# Patient Record
Sex: Female | Born: 1960 | Hispanic: Yes | Marital: Married | Smoking: Never smoker
Health system: Southern US, Community
[De-identification: ages and names within clinical notes are randomized; demographics above are authoritative.]

## PROBLEM LIST (undated history)

## (undated) DIAGNOSIS — I10 Essential (primary) hypertension: Secondary | ICD-10-CM

## (undated) DIAGNOSIS — R1110 Vomiting, unspecified: Secondary | ICD-10-CM

## (undated) DIAGNOSIS — R635 Abnormal weight gain: Secondary | ICD-10-CM

## (undated) DIAGNOSIS — R410 Disorientation, unspecified: Secondary | ICD-10-CM

## (undated) DIAGNOSIS — F32A Depression, unspecified: Secondary | ICD-10-CM

## (undated) DIAGNOSIS — M79606 Pain in leg, unspecified: Secondary | ICD-10-CM

## (undated) DIAGNOSIS — K2970 Gastritis, unspecified, without bleeding: Secondary | ICD-10-CM

## (undated) DIAGNOSIS — R531 Weakness: Secondary | ICD-10-CM

## (undated) DIAGNOSIS — IMO0002 Reserved for concepts with insufficient information to code with codable children: Secondary | ICD-10-CM

## (undated) DIAGNOSIS — G709 Myoneural disorder, unspecified: Secondary | ICD-10-CM

## (undated) DIAGNOSIS — F329 Major depressive disorder, single episode, unspecified: Secondary | ICD-10-CM

## (undated) HISTORY — DX: Reserved for concepts with insufficient information to code with codable children: IMO0002

## (undated) HISTORY — DX: Pain in leg, unspecified: M79.606

## (undated) HISTORY — DX: Essential (primary) hypertension: I10

## (undated) HISTORY — DX: Major depressive disorder, single episode, unspecified: F32.9

## (undated) HISTORY — DX: Disorientation, unspecified: R41.0

## (undated) HISTORY — DX: Depression, unspecified: F32.A

## (undated) HISTORY — DX: Weakness: R53.1

## (undated) HISTORY — DX: Gastritis, unspecified, without bleeding: K29.70

## (undated) HISTORY — DX: Abnormal weight gain: R63.5

## (undated) HISTORY — DX: Vomiting, unspecified: R11.10

## (undated) HISTORY — DX: Myoneural disorder, unspecified: G70.9

---

## 2006-07-20 HISTORY — PX: OVARIAN CYST SURGERY: SHX726

## 2008-07-20 HISTORY — PX: SHOULDER SURGERY: SHX246

## 2009-07-23 LAB — HM PAP SMEAR: HM Pap smear: NORMAL

## 2010-11-21 ENCOUNTER — Encounter: Payer: Self-pay | Admitting: Family Medicine

## 2010-11-21 ENCOUNTER — Ambulatory Visit (INDEPENDENT_AMBULATORY_CARE_PROVIDER_SITE_OTHER): Payer: 59 | Admitting: Family Medicine

## 2010-11-21 VITALS — BP 140/80 | HR 72 | Temp 97.8°F | Resp 12 | Ht 62.75 in | Wt 171.0 lb

## 2010-11-21 DIAGNOSIS — R112 Nausea with vomiting, unspecified: Secondary | ICD-10-CM

## 2010-11-21 DIAGNOSIS — F3289 Other specified depressive episodes: Secondary | ICD-10-CM

## 2010-11-21 DIAGNOSIS — R1013 Epigastric pain: Secondary | ICD-10-CM

## 2010-11-21 DIAGNOSIS — F32A Depression, unspecified: Secondary | ICD-10-CM

## 2010-11-21 DIAGNOSIS — F329 Major depressive disorder, single episode, unspecified: Secondary | ICD-10-CM

## 2010-11-21 LAB — CBC WITH DIFFERENTIAL/PLATELET
Basophils Absolute: 0 10*3/uL (ref 0.0–0.1)
Basophils Relative: 0.4 % (ref 0.0–3.0)
Eosinophils Absolute: 0 10*3/uL (ref 0.0–0.7)
HCT: 39.3 % (ref 36.0–46.0)
Hemoglobin: 13.4 g/dL (ref 12.0–15.0)
Lymphs Abs: 0.9 10*3/uL (ref 0.7–4.0)
MCHC: 34.1 g/dL (ref 30.0–36.0)
MCV: 91.8 fl (ref 78.0–100.0)
Monocytes Absolute: 0.3 10*3/uL (ref 0.1–1.0)
Neutro Abs: 6.5 10*3/uL (ref 1.4–7.7)
RBC: 4.28 Mil/uL (ref 3.87–5.11)
RDW: 15.2 % — ABNORMAL HIGH (ref 11.5–14.6)

## 2010-11-21 LAB — HEPATIC FUNCTION PANEL
AST: 37 U/L (ref 0–37)
Albumin: 4.8 g/dL (ref 3.5–5.2)
Alkaline Phosphatase: 76 U/L (ref 39–117)
Bilirubin, Direct: 0.1 mg/dL (ref 0.0–0.3)
Total Bilirubin: 0.8 mg/dL (ref 0.3–1.2)

## 2010-11-21 LAB — BASIC METABOLIC PANEL
GFR: 86.34 mL/min (ref >60.00)
Glucose, Bld: 115 mg/dL — ABNORMAL HIGH (ref 70–99)
Potassium: 4.2 mEq/L (ref 3.5–5.1)
Sodium: 143 mEq/L (ref 135–145)

## 2010-11-21 MED ORDER — PROMETHAZINE HCL 25 MG PO TABS: 25.0000 mg | ORAL_TABLET | Freq: Four times a day (QID) | ORAL | Status: DC | PRN

## 2010-11-21 MED ORDER — PROMETHAZINE HCL 25 MG/ML IJ SOLN
25.0000 mg | Freq: Once | INTRAMUSCULAR | Status: AC
  Administered 2010-11-21: 25 mg via INTRAMUSCULAR

## 2010-11-21 NOTE — Patient Instructions (Signed)
Follow up immediately for any fever or persistent abdominal pain.

## 2010-11-21 NOTE — Progress Notes (Signed)
  Subjective:    Patient ID: Pamela Hubbard, female    DOB: 08/01/1960, 50 y.o.   MRN: [id number]  HPI Patient seen to establish care. Past medical history reviewed. Questionable remote history peptic ulcer disease many years ago. Reported history of depression currently treated with Cymbalta. She has some chronic shoulder and neck pain with prior rotator cuff shoulder surgery. Reported history of ovarian cyst.  She is scheduled to see orthopedist next week here in town regarding her right shoulder.  Seen today with some recurrent midepigastric pain with associated nausea and vomiting. Symptoms are worse after eating-particularly after heavy meals or fatty foods.  . She relates about one year ago had EGD when living in [place] with no peptic ulcer disease noted. Was seen at urgent care about one week ago and reportedly had normal ultrasound . No gallstones noted. Occasional radiation to the back and right upper quadrant but mostly midepigastric. No fever. No hematemesis. No melena. No nonsteroidal use. Patient denies appetite or weight changes. Nonsmoker. Occasional alcohol use. No history of pancreatitis.   Review of Systems  Constitutional: Negative for fever, chills, activity change, appetite change, fatigue and unexpected weight change.  Respiratory: Negative for cough and shortness of breath.   Cardiovascular: Negative for chest pain.  Gastrointestinal: Positive for nausea and vomiting. Negative for abdominal pain, diarrhea, constipation, blood in stool and abdominal distention.  Genitourinary: Negative for dysuria and flank pain.  Neurological: Negative for dizziness.       Objective:   Physical Exam  Constitutional: She appears well-developed and well-nourished.  Cardiovascular: Normal rate and regular rhythm.   No murmur heard. Pulmonary/Chest: Effort normal and breath sounds normal. No respiratory distress. She has no wheezes. She has no rales.  Abdominal: Soft. Bowel sounds are normal.  She exhibits no distension.       Tender midepigastric area. No guarding or rebound. No hepatomegaly or splenomegaly noted.  Musculoskeletal: She exhibits no edema.          Assessment & Plan:  #1 recurrent midepigastric pain with associated nausea and vomiting. Recent ultrasound reportedly negative for gallstones. Obtain old records. Obtain labs with CBC, lipase, hepatic panel, and basic metabolic panel. ?acalculous cholecystitis. #2 chronic right shoulder pain  #3 history of depression #4 reported history of peptic ulcer disease several years ago

## 2010-11-23 DIAGNOSIS — F329 Major depressive disorder, single episode, unspecified: Secondary | ICD-10-CM | POA: Insufficient documentation

## 2010-11-23 DIAGNOSIS — F32A Depression, unspecified: Secondary | ICD-10-CM | POA: Insufficient documentation

## 2010-11-24 NOTE — Progress Notes (Signed)
Quick Note:  Pt informed. She will get [place] results to Dr Caryl Never ______

## 2010-12-01 ENCOUNTER — Encounter: Payer: Self-pay | Admitting: Family Medicine

## 2010-12-04 ENCOUNTER — Ambulatory Visit (HOSPITAL_BASED_OUTPATIENT_CLINIC_OR_DEPARTMENT_OTHER)
Admission: RE | Admit: 2010-12-04 | Discharge: 2010-12-04 | Disposition: A | Discharge status: Home / Self Care | Payer: 59 | Source: Ambulatory Visit | Attending: Orthopedic Surgery | Admitting: Orthopedic Surgery

## 2010-12-04 DIAGNOSIS — D1600 Benign neoplasm of scapula and long bones of unspecified upper limb: Secondary | ICD-10-CM | POA: Insufficient documentation

## 2010-12-04 DIAGNOSIS — Z01812 Encounter for preprocedural laboratory examination: Secondary | ICD-10-CM | POA: Insufficient documentation

## 2010-12-04 DIAGNOSIS — M719 Bursopathy, unspecified: Secondary | ICD-10-CM | POA: Insufficient documentation

## 2010-12-04 DIAGNOSIS — M25819 Other specified joint disorders, unspecified shoulder: Secondary | ICD-10-CM | POA: Insufficient documentation

## 2010-12-04 DIAGNOSIS — M67919 Unspecified disorder of synovium and tendon, unspecified shoulder: Secondary | ICD-10-CM | POA: Insufficient documentation

## 2010-12-04 LAB — POCT HEMOGLOBIN-HEMACUE: Hemoglobin: 12.6 g/dL (ref 12.0–15.0)

## 2010-12-09 NOTE — Op Note (Signed)
[name], Pamela Hubbard                  ACCOUNT NO.:  [id number]  MEDICAL RECORD NO.:  [id number]           PATIENT TYPE:  LOCATION:                                 FACILITY:  PHYSICIAN:  Katy Fitch. Leronda Lewers, M.D.      DATE OF BIRTH:  DATE OF PROCEDURE:  12/01/2010 DATE OF DISCHARGE:                              OPERATIVE REPORT   PREOPERATIVE DIAGNOSIS:  Two-year history of right shoulder pain, status post prior arthroscopic subacromial decompression and arthroscopic distal clavicle resection completed in [place].  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  OPERATION: 1. Open lateral acromioplasty, right shoulder. 2. Bursectomy, right shoulder. 3. Simple diagnostic arthroscopy to confirm intact labrum, biceps     origin and rotator cuff articular side integrity.  OPERATING SURGEON:  Katy Fitch. Tyrail Grandfield, MDASSISTANT:  Marveen Reeks Dasnoit, PA-C  ANESTHESIA:  General endotracheal supplemented by a right brachial plexus block.  SUPERVISED ANESTHESIOLOGIST:  Burna Forts, MD  INDICATIONS:  Pamela Hubbard is a 50 year old right-hand dominant woman who removed from [place] to [place].  Her husband is working with [employer] in [place].  After evaluating her husband for carpal tunnel syndrome, Mr. Gloeckner requested that we evaluate his wife shoulder.  She had a history of prior arthroscopic subacromial decompression, distal clavicle resection and partial open acromionectomy performed in [place].  She reports that following surgery she was never relieved of pain.  She had pain with initiation of abduction, internal external rotation, crepitation beneath the acromion and night pain.  On clinical examination, she was noted to have a number of orthopedic issues including cervical degenerative disk disease, a painful arc of shoulder abduction, scaption and  external rotation, crepitation beneath the acromion, and plain x-rays revealed what appeared to be an anterior acromioplasty with some residual lateral acromion present.  She had marked guarding.  She also had arthritis of multiple small joints.  We advised her to obtain an MRI of the shoulder in that her prior MRI from [place] had revealed significant tendinosis.  Our concern was that she might be progressing to a full-thickness rotator cuff tear.  Her MRI was accomplished a tried imaging and notable for what appeared to be a retained profile of the lateral acromion with bursal hypertrophy surrounding it, suggesting a chronic impingement point.  Clinically, she was tenderness region, she had crepitation, a repeat anterior tilt view of the acromion revealed lateral osteophyte remaining, therefore, we recommended proceeding with exploration at this time anticipating open acromioplasty, bursectomy and open palpation and inspection of the rotator cuff.  We also advised to possible diagnostic shoulder scope to be absolute certain she does not have a SLAP lesion or other pathology accounting for her persistent discomfort.  After informed consent, Pamela Hubbard was brought to the operating room at this time.  PROCEDURE:  [name] was brought to room 2 of the [facility] and placed in supine position on the operating table.  Following an anesthesia consult by Dr. Jacklynn Bue, general anesthesia by endotracheal technique supplemented by a  right interscalene block was recommended and accepted by [name].  Under IV sedation conditions in the holding area, Dr. Jacklynn Bue placed a lidocaine and Marcaine interscalene block without complication.  Pamela Hubbard was then transferred to room 2 of the [facility] and placed in supine position on the operating table.  Under Dr. Marlane Mingle direct supervision, general anesthesia by endotracheal technique was induced followed by positioning Ms.  Hubbard in the beach-chair position with the aid of a torso and head holder designed for shoulder arthroscopy.  Detailed examination of shoulder under anesthesia revealed marked crepitation at the anterolateral corner of the acromion and no evidence of adhesive capsulitis.  Under general anesthesia, she had combined elevation 180 degrees, external rotation 95, internal rotation of 90, and extension to the interscapular plane.  The right upper extremity and forequarter were prepped with DuraPrep and draped with impervious arthroscopy drapes.  A 1 gram of Ancef was administered as IV prophylactic antibiotic.  Following a routine surgical time-out, the procedure commenced with resection of prior oblique surgical scar.  Subcutaneous tissues were meticulously dissected revealing the deltoid fascia.  Two large nonabsorbable green sutures were identified with figure-of-eight technique.  These were evident on the preoperative MRI and cause significant signal disruption.  These dissected free and removed.  The site of the prior muscle-splitting incision was evident due to a scar raphe between the anterior middle third of the deltoid.  This was entered with sharp dissection to the bursa.  There was a very fibrotic and thick bursa and on palpation of the anterolateral acromial morphology, there was an osteophyte measuring approximately 1 cm from anterior to posterior, 9 mm from superior to inferior and approximately 12 mm in depth.  This profile of the lateral acromion may have regrown after the prior open acromioplasty due to periosteal stripping and/or stripping of the deltoid origin.  This definitely caused a incisor type pressure on the cuff.  Bursal adhesions were lysed with use of a Cobb elevator and digital dissection followed by complete bursectomy using tenotomy scissors and forceps.  The rotator cuff was directly inspected and palpated.  There appeared to be some soft areas in the  supraspinatus, but there is no evidence of a full-thickness tear.  The long head biceps was palpable in the groove, but appeared to be diminutive.  Based on the small profile of the long head biceps and the soft feel of the supraspinatus, in my judgment a diagnostic scope was indicated.  The wound was thoroughly irrigated, the deltoid split repaired with mattress sutures of 0 Vicryl, the skin repaired with subcutaneous 2-0 Vicryl and intradermal 3-0 Prolene.  The arthroscope was placed through standard posterior viewing portal with blunt technique and the shoulder distended with sterile saline. The glenoid and humeral head were carefully inspected.  The hyaline articular cartilage was normal.  The insertions of the subscapularis, supraspinatus, infraspinatus inspected were found to be normal.  The labral was unremarkable.  The biceps was stable at its proximal origin and appeared to be of small caliber, but normal through the rotator interval.  The joint was then irrigated with sterile saline with a syringe through the scope cannula followed by removal of the arthroscope.  It appears that Ms. Canizales had an osteophyte causing residual impingement and florid bursitis with bursal fibrosis.  Our plan will be to initiate immediate active range of motion exercises postoperatively as this was performed to a simple muscle-splitting incision.  There were no apparent complications.  For aftercare, she  is provided prescriptions for Dilaudid 2 mg 1 p.o. q.4-6 hours p.r.n. pain 30 tablets without refill, also Motrin 600 mg 1 p.o. q.6 h. p.r.n. pain 30 tablets with 1 refill and Keflex 500 mg 1 p.o. q.8 h x4 days as a prophylactic antibiotic.     Katy Fitch Elleen Coulibaly, M.D.     RVS/MEDQ  D:  12/04/2010  T:  12/05/2010  Job:  [id number]  Electronically Signed by Josephine Igo M.D. on 12/09/2010 09:45:09 AM

## 2010-12-31 ENCOUNTER — Ambulatory Visit (INDEPENDENT_AMBULATORY_CARE_PROVIDER_SITE_OTHER)
Admission: RE | Admit: 2010-12-31 | Discharge: 2010-12-31 | Disposition: A | Discharge status: Home / Self Care | Payer: 59 | Source: Ambulatory Visit | Attending: Family Medicine | Admitting: Family Medicine

## 2010-12-31 ENCOUNTER — Ambulatory Visit (INDEPENDENT_AMBULATORY_CARE_PROVIDER_SITE_OTHER): Payer: 59 | Admitting: Family Medicine

## 2010-12-31 ENCOUNTER — Encounter: Payer: Self-pay | Admitting: Family Medicine

## 2010-12-31 VITALS — BP 110/70 | Temp 98.0°F | Wt 177.0 lb

## 2010-12-31 DIAGNOSIS — R5381 Other malaise: Secondary | ICD-10-CM

## 2010-12-31 DIAGNOSIS — R5383 Other fatigue: Secondary | ICD-10-CM

## 2010-12-31 DIAGNOSIS — F3289 Other specified depressive episodes: Secondary | ICD-10-CM

## 2010-12-31 DIAGNOSIS — M2550 Pain in unspecified joint: Secondary | ICD-10-CM

## 2010-12-31 DIAGNOSIS — F32A Depression, unspecified: Secondary | ICD-10-CM

## 2010-12-31 DIAGNOSIS — F329 Major depressive disorder, single episode, unspecified: Secondary | ICD-10-CM

## 2010-12-31 LAB — SEDIMENTATION RATE: Sed Rate: 11 mm/hr (ref 0–22)

## 2010-12-31 MED ORDER — DULOXETINE HCL 60 MG PO CPEP: 60.0000 mg | ORAL_CAPSULE | Freq: Every day | ORAL | Status: DC

## 2010-12-31 NOTE — Progress Notes (Signed)
  Subjective:    Patient ID: Pamela Hubbard, female    DOB: [birth year], 50 y.o.   MRN: [id number]  HPI Patient seen with multiple complaints. She is complaining of progressive fatigue. Recently seen for abdominal pain and that has resolved. She complains of bilateral knee pain left greater than right. No obvious swelling. Is also complaining of multiple joint pain including wrist and hands and occasionally shoulders. No family history of inflammatory arthritis.  She feels somewhat depressed at times. She's not had any recent injury. She had recent right shoulder surgery. Placed on low-dose gabapentin per surgeon. Also take ibuprofen 600 mg but little relief from her pain. She has a sensation of weakness occasionally in her knee. She does take Cymbalta 20 mg daily but do not believe this is helping much. She's tried tramadol for her joint pains and is not helping. No reported fever.   Review of Systems  Constitutional: Positive for fatigue. Negative for fever, chills, appetite change and unexpected weight change.  Respiratory: Negative for shortness of breath.   Cardiovascular: Negative for chest pain and leg swelling.  Genitourinary: Negative for dysuria.  Musculoskeletal: Positive for arthralgias. Negative for joint swelling.  Skin: Negative for rash.  Neurological: Negative for weakness.  Hematological: Negative for adenopathy. Does not bruise/bleed easily.  Psychiatric/Behavioral: Positive for dysphoric mood.       Objective:   Physical Exam  Constitutional: She is oriented to person, place, and time. She appears well-developed and well-nourished.  Neck: Neck supple. No thyromegaly present.  Cardiovascular: Normal rate, regular rhythm and normal heart sounds.   Pulmonary/Chest: Effort normal and breath sounds normal. No respiratory distress. She has no wheezes. She has no rales.  Musculoskeletal: She exhibits no edema.       Left knee reveals full range of motion. No effusion. Ligament  testing is normal. No localized tenderness. She does not have objective evidence of inflammation of her hands or wrist such as erythema, warmth, or any effusions or edema.  Lymphadenopathy:    She has no cervical adenopathy.  Neurological: She is alert and oriented to person, place, and time. No cranial nerve deficit.  Psychiatric: She has a normal mood and affect. Her behavior is normal.          Assessment & Plan:  #1 Patient presents with multiple arthralgias. No objective evidence for inflammation. Obtain labs to rule out inflammatory arthritis. Titrate Cymbalta 60 mg daily which will hopefulle help with her mood issues as well. #2 fatigue. Unclear etiology. Titrate Cymbalta. Check TSH.

## 2011-01-01 LAB — ANA: Anti Nuclear Antibody(ANA): NEGATIVE

## 2011-01-01 NOTE — Progress Notes (Signed)
Quick Note:  Pt informed ______ 

## 2011-02-06 ENCOUNTER — Ambulatory Visit: Payer: 59 | Admitting: Family Medicine

## 2011-02-18 ENCOUNTER — Encounter: Payer: Self-pay | Admitting: Family Medicine

## 2011-02-18 ENCOUNTER — Ambulatory Visit (INDEPENDENT_AMBULATORY_CARE_PROVIDER_SITE_OTHER): Payer: 59 | Admitting: Family Medicine

## 2011-02-18 VITALS — BP 140/90 | Temp 98.3°F | Wt 179.0 lb

## 2011-02-18 DIAGNOSIS — E041 Nontoxic single thyroid nodule: Secondary | ICD-10-CM

## 2011-02-18 NOTE — Progress Notes (Signed)
  Subjective:    Patient ID: Pamela Hubbard, female    DOB: Jun 18, 1961, 50 y.o.   MRN: [id number]  HPI Patient seen for evaluation possible left thyroid nodule. Recently seen by orthopedist who noted subcentimeter left thyroid nodule. Patient has not had any swallowing difficulties. No reported history of neck mass previously.  She's had some chronic upper back pain. Recently referred for physical therapy. She recently had a physical in [facility] and reports that she had low hemoglobin. We do not have copy of those labs. She had CBC here in May with normal hemoglobin. She is postmenopausal. No recent blood in stools. No reported history of anemia   Review of Systems  Constitutional: Positive for fatigue. Negative for fever and chills.  HENT: Negative for sore throat and trouble swallowing.   Eyes: Negative for visual disturbance.  Cardiovascular: Negative for chest pain.  Genitourinary: Negative for dysuria and hematuria.  Musculoskeletal: Positive for back pain. Negative for gait problem.  Neurological: Negative for dizziness.  Hematological: Negative for adenopathy. Does not bruise/bleed easily.       Objective:   Physical Exam  Constitutional: She is oriented to person, place, and time. She appears well-developed and well-nourished.  HENT:  Right Ear: External ear normal.  Left Ear: External ear normal.  Mouth/Throat: Oropharynx is clear and moist.  Neck: Neck supple. No thyromegaly present.       Could not appreciate any definite nodules.  Cardiovascular: Normal rate, regular rhythm and normal heart sounds.   No murmur heard. Pulmonary/Chest: Effort normal and breath sounds normal. No respiratory distress. She has no wheezes. She has no rales.  Musculoskeletal: She exhibits no edema.  Lymphadenopathy:    She has no cervical adenopathy.  Neurological: She is alert and oriented to person, place, and time. No cranial nerve deficit.          Assessment & Plan:  #1  history reported subcentimeter left thyroid nodule. Cannot appreciate a definitive nodule exam today. We'll set up soft tissue ultrasound to further evaluate #2 reported anemia per patient from recent labs couple weeks ago. No history of anemia from previous labs here. She'll drop off copy of recent labs to further assess. If this confirms anemia should be further evaluated as she is postmenopausal

## 2011-02-18 NOTE — Patient Instructions (Signed)
Bring in copy of your recent labs for review.

## 2011-02-25 ENCOUNTER — Ambulatory Visit
Admission: RE | Admit: 2011-02-25 | Discharge: 2011-02-25 | Disposition: A | Discharge status: Home / Self Care | Payer: 59 | Source: Ambulatory Visit | Attending: Family Medicine | Admitting: Family Medicine

## 2011-02-25 DIAGNOSIS — E041 Nontoxic single thyroid nodule: Secondary | ICD-10-CM

## 2011-02-27 ENCOUNTER — Ambulatory Visit (INDEPENDENT_AMBULATORY_CARE_PROVIDER_SITE_OTHER): Payer: 59 | Admitting: Family Medicine

## 2011-02-27 ENCOUNTER — Encounter: Payer: Self-pay | Admitting: Family Medicine

## 2011-02-27 DIAGNOSIS — G8929 Other chronic pain: Secondary | ICD-10-CM

## 2011-02-27 DIAGNOSIS — R350 Frequency of micturition: Secondary | ICD-10-CM

## 2011-02-27 DIAGNOSIS — M549 Dorsalgia, unspecified: Secondary | ICD-10-CM

## 2011-02-27 DIAGNOSIS — R109 Unspecified abdominal pain: Secondary | ICD-10-CM

## 2011-02-27 DIAGNOSIS — R300 Dysuria: Secondary | ICD-10-CM

## 2011-02-27 LAB — POCT URINALYSIS DIPSTICK
Bilirubin, UA: NEGATIVE
Glucose, UA: NEGATIVE
Leukocytes, UA: NEGATIVE
Nitrite, UA: NEGATIVE
Urobilinogen, UA: 0.2

## 2011-02-27 LAB — CBC WITH DIFFERENTIAL/PLATELET
Basophils Relative: 1.1 % (ref 0.0–3.0)
Eosinophils Absolute: 0.1 10*3/uL (ref 0.0–0.7)
HCT: 38.8 % (ref 36.0–46.0)
Hemoglobin: 13.1 g/dL (ref 12.0–15.0)
Lymphocytes Relative: 34.3 % (ref 12.0–46.0)
Lymphs Abs: 1.6 10*3/uL (ref 0.7–4.0)
MCHC: 33.7 g/dL (ref 30.0–36.0)
MCV: 91.6 fl (ref 78.0–100.0)
Monocytes Absolute: 0.4 10*3/uL (ref 0.1–1.0)
Neutro Abs: 2.4 10*3/uL (ref 1.4–7.7)
RBC: 4.24 Mil/uL (ref 3.87–5.11)

## 2011-02-27 LAB — GLUCOSE, POCT (MANUAL RESULT ENTRY): POC Glucose: 106

## 2011-02-27 NOTE — Progress Notes (Signed)
  Subjective:    Patient ID: Pamela Hubbard, female    DOB: 09/18/1960, 50 y.o.   MRN: [id number]  HPI Here for the following  Four-day history of abdominal pain. This is suprapubic lower abdomen and diffuse and somewhat bilateral. Moderate severity. Relatively continuous. She is also complaining of urine frequency and increased thirst. She denies any burning with urination. No hematuria. Intermittent sweats but no documented fever. Recent labs from [place] reviewed with blood sugar 135 and white blood count 2.7 thousand. She reportedly had followup CBC that was normal following that.  Patient feels somewhat bloated suprapubic area. Better after urination. Intermittent constipation and occasional diarrhea as well. These symptoms have been relatively chronic. She has chronic back pain which is unchanged and is currently in physical therapy.  Recent notice by hand surgeon of subcentimeter left thyroid nodule. Ultrasound reveals several bilateral thyroid nodules none meeting size criteria for fine needle aspirate biopsy.  Pt has not noted any masses.  No dysphagia.   Review of Systems  Constitutional: Positive for fatigue. Negative for fever, chills, activity change, appetite change and unexpected weight change.  HENT: Negative for trouble swallowing.   Respiratory: Negative for cough and shortness of breath.   Cardiovascular: Negative for chest pain, palpitations and leg swelling.  Gastrointestinal: Negative for vomiting, diarrhea and blood in stool.  Genitourinary: Positive for urgency and frequency. Negative for hematuria.  Musculoskeletal: Positive for back pain.  Skin: Negative for rash.       Objective:   Physical Exam  Constitutional: She appears well-developed and well-nourished.  HENT:  Right Ear: External ear normal.  Left Ear: External ear normal.  Mouth/Throat: Oropharynx is clear and moist.  Cardiovascular: Normal rate and regular rhythm.   Pulmonary/Chest: Effort normal and  breath sounds normal. No respiratory distress. She has no wheezes. She has no rales.  Abdominal: Soft. Bowel sounds are normal. She exhibits no distension and no mass. There is no rebound and no guarding.       Minimal bilateral lower quadrant tenderness but symptoms somewhat inconsistent.  No guarding or rebound.  No mass.  No organomegaly.  Skin: No rash noted.          Assessment & Plan:  #1 recent thyroid nodules. Multiple nodules very small not meeting criteria for biopsy. Reassurance  #2 suprapubic pain. Urinalysis unremarkable.  Doubt urinary tract infection  #3 urine frequency. No evidence for UTI. Nonfasting blood sugar 106 so no evidence for diabetes.  Discussed minimizing caffeine use.  Consider med such as Vesicare if symptoms persist.

## 2011-02-27 NOTE — Patient Instructions (Signed)
Follow up immediately for any fever or worsening abdominal pain-or if abdominal pain persists.

## 2011-02-27 NOTE — Progress Notes (Signed)
Quick Note:  Pt has OV today, informed of result ______

## 2011-03-03 NOTE — Progress Notes (Signed)
Quick Note:  Pt informed ______ 

## 2011-03-13 ENCOUNTER — Other Ambulatory Visit: Payer: Self-pay | Admitting: Family Medicine

## 2011-03-13 DIAGNOSIS — F32A Depression, unspecified: Secondary | ICD-10-CM

## 2011-03-13 DIAGNOSIS — M2550 Pain in unspecified joint: Secondary | ICD-10-CM

## 2011-03-13 DIAGNOSIS — F329 Major depressive disorder, single episode, unspecified: Secondary | ICD-10-CM

## 2011-03-13 DIAGNOSIS — R5383 Other fatigue: Secondary | ICD-10-CM

## 2011-03-13 MED ORDER — DULOXETINE HCL 60 MG PO CPEP: 60.0000 mg | ORAL_CAPSULE | Freq: Every day | ORAL | Status: DC

## 2011-03-13 NOTE — Telephone Encounter (Signed)
Pt req refill of DULoxetine (CYMBALTA) 20 mg 1 a day. Pls call in [facility] in [place], [place] [phone]

## 2011-03-16 ENCOUNTER — Telehealth: Payer: Self-pay | Admitting: *Deleted

## 2011-03-16 NOTE — Telephone Encounter (Signed)
PC from pt who is visiting daughter in [place] for several weeks.  Pt Cymbalta 60 mg is too strong,  last OV she states she requested a lower dose, so she has been cutting them in 1/2.  Pt is now requesting Cymbalta 20 mg and needs a refill.

## 2011-03-16 NOTE — Telephone Encounter (Signed)
Lower dose is 30mg  .  Let's reduce her dose to 30 mg once daily.

## 2011-03-18 MED ORDER — DULOXETINE HCL 30 MG PO CPEP: 30.0000 mg | ORAL_CAPSULE | Freq: Every day | ORAL | Status: DC

## 2011-03-18 NOTE — Telephone Encounter (Signed)
Pt informed

## 2011-05-01 ENCOUNTER — Ambulatory Visit (INDEPENDENT_AMBULATORY_CARE_PROVIDER_SITE_OTHER): Payer: 59 | Admitting: Family Medicine

## 2011-05-01 ENCOUNTER — Encounter: Payer: Self-pay | Admitting: Family Medicine

## 2011-05-01 VITALS — BP 140/90 | Temp 98.4°F | Wt 185.0 lb

## 2011-05-01 DIAGNOSIS — Z23 Encounter for immunization: Secondary | ICD-10-CM

## 2011-05-01 DIAGNOSIS — M546 Pain in thoracic spine: Secondary | ICD-10-CM

## 2011-05-01 MED ORDER — CYCLOBENZAPRINE HCL 5 MG PO TABS: 5.0000 mg | ORAL_TABLET | Freq: Three times a day (TID) | ORAL | Status: AC | PRN

## 2011-05-01 NOTE — Patient Instructions (Signed)
Continue with heat and muscle massage.

## 2011-05-01 NOTE — Progress Notes (Signed)
  Subjective:    Patient ID: Pamela Hubbard, female    DOB: 1961-05-24, 50 y.o.   MRN: [id number]  HPI Acute visit 2-3 week history of back pain. Location is lower thoracic bilaterally. No spinal pain. No injury. No change of activities. Pain is constant. Moderate severity. Soreness worse with movement. Tramadol and heat without much. No radiculopathy symptoms. No weakness. Patient had some chronic lumbar pain intermittently for years but this pain is somewhat different. Patient has taken Advil also without much relief.  No rash.   Review of Systems  Constitutional: Negative for appetite change and unexpected weight change.  Respiratory: Negative for cough and shortness of breath.   Cardiovascular: Negative for chest pain.  Gastrointestinal: Negative for abdominal pain.  Musculoskeletal: Positive for back pain. Negative for gait problem.  Neurological: Negative for weakness and numbness.       Objective:   Physical Exam  Constitutional: She appears well-developed and well-nourished.  Cardiovascular: Normal rate and regular rhythm.   Pulmonary/Chest: Effort normal and breath sounds normal. No respiratory distress. She has no wheezes. She has no rales.  Musculoskeletal: She exhibits no edema.       Mild tenderness diffusely lower thoracic region bilaterally with palpation of musculature. No spinal tenderness. Straight leg raise is negative bilaterally  Neurological:       Full-strength lower extremities.          Assessment & Plan:  Thoracic back pain. Suspect muscular. Continue heat, muscle massage, Advil, and add Flexeril 5 mg 1-2 each bedtime when necessary

## 2011-06-17 ENCOUNTER — Encounter: Payer: 59 | Attending: Physical Medicine & Rehabilitation | Admitting: Physical Medicine & Rehabilitation

## 2011-06-17 DIAGNOSIS — M79609 Pain in unspecified limb: Secondary | ICD-10-CM | POA: Insufficient documentation

## 2011-06-17 DIAGNOSIS — Z79899 Other long term (current) drug therapy: Secondary | ICD-10-CM | POA: Insufficient documentation

## 2011-06-17 DIAGNOSIS — F329 Major depressive disorder, single episode, unspecified: Secondary | ICD-10-CM | POA: Insufficient documentation

## 2011-06-17 DIAGNOSIS — M25519 Pain in unspecified shoulder: Secondary | ICD-10-CM | POA: Insufficient documentation

## 2011-06-17 DIAGNOSIS — M7530 Calcific tendinitis of unspecified shoulder: Secondary | ICD-10-CM

## 2011-06-17 DIAGNOSIS — M531 Cervicobrachial syndrome: Secondary | ICD-10-CM

## 2011-06-17 DIAGNOSIS — G894 Chronic pain syndrome: Secondary | ICD-10-CM | POA: Insufficient documentation

## 2011-06-17 DIAGNOSIS — F3289 Other specified depressive episodes: Secondary | ICD-10-CM

## 2011-06-17 DIAGNOSIS — M47812 Spondylosis without myelopathy or radiculopathy, cervical region: Secondary | ICD-10-CM

## 2011-06-17 DIAGNOSIS — G4700 Insomnia, unspecified: Secondary | ICD-10-CM | POA: Insufficient documentation

## 2011-06-17 NOTE — Letter (Signed)
June 17, 2011  Pamela Hubbard. Sypher, MD [address] [place] [address].  RE:  Pamela Hubbard Policy ID # Policy Holder:  Dear Pamela Hubbard,  I had the opportunity to meet Pamela Hubbard, today regarding her chronic pain complaints.  I spent a comprehensive amount of time on her case today.  Thank you kindly for the referral.  Full note is as follows.  HISTORY OF PRESENT ILLNESS:  This is a 50 year old Hispanic female, reports some chronic history of cervical pain which was fairly mild and under control.  She reports that in 2009 she injured her right shoulder and has had right shoulder pain since then.  She has had 2 shoulder surgeries with the most recent being a right shoulder subacromial decompression that was done in May of this year.  She has had improvement in her range of motion, but has persistent pain in the right shoulder, arm, and in the intra scapular/cervical region. I only have second hand interpretation of an EMG and nerve conduction study which was read as normal.  Apparently in the cervical spine, she has multilevel cervical disk disease with foraminal narrowing at C5-C6 and C6-7 bilaterally as well as C3-C4 at the left.  There is also report of a lumbar spine film that was read as degenerative arthritis.  The patient states that she has tried some therapy in the past with really no benefit in her pain. She has taken ibuprofen without benefit.  She has tried hydrocodone, but they caused significant nausea.  She has been on antidepressants in the past including Prozac for some period of time.  She changed to Cymbalta recently, however, she is now off the medication as she did not feel that it was helping and she is on no antidepressant at present.  She states that the right arm feels heavy, sometimes burning and tingling.  She describes the pain as stabbing.  The pain is 7- 8/10.  Pain keeps her from sleeping at night at times due to the throbbing and pain and  numbness.  She denies symptoms on the left side, more in the legs.  She has had some low back tenderness, but it does not compare to the upper extremity symptoms.  She does have more pain when she is active or when she does simple things such as walking or tries to work around the house.  She is not employed currently and husband states she may be a bit depressed that she can get back to work.  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  PAST MEDICAL HISTORY:  Notable for shoulder injury and surgeries as noted above.  She has had ovary surgery in 2008 and C-section in 1983.  History is also notable for depression and reflux disease.  CURRENT MEDICATIONS: 1. Nexium 40 mg daily. 2. Tramadol 50 mg 1 twice a day.  She states that this helps     somewhat but does not last for very long.  ALLERGIES/INTOLERANCES:  HYDROCODONE which causes nausea as mentioned above.  SOCIAL HISTORY:  Patient is married.  Husband is with her today and quite supportive.  She denies smoking or drinking.  She has not worked since 2009.  FAMILY HISTORY:  Unremarkable.  REVIEW OF SYSTEMS:  Notable for depression, confusion, vomiting, weight gain, weakness.  Full 12-point review is in the written health and history section of the chart.  PHYSICAL EXAMINATION:  VITAL SIGNS:  Blood pressure is 165/95 on the left, 194/137 on the  right, pulse 68, respiratory rate 16, AND she is satting 99% on room air. GENERAL:  The patient generally pleasant, a bit anxious.  She speaks fairly good [place] and has fair intelligibility but sometimes needs paraphrasing.  She is over overweight.  She has large pendulous breast.  In standing today for me she tends to lean towards the right.  The right arm hangs lower than the left at rest.  When cued and physically repositioned, she does a bit better.  She has to stand with a bit of a head forward and lumbar forward position.  Strength in the left upper extremity is  grossly 4/5.  Motor exam is difficult to assess.  I would say she grossly has 3 to 3+/5 strength but there is a lot of pain inhibition overly all throughout the arm perhaps except in the hand intrinsics.  Lower extremity strength is grossly 4-5/5.  She has subjective loss of light touch discrimination on the right hand, although I am not sure how consistent this was.  Reflexes are brisk and 2+ in all 4 limbs.  She has some mild allodynia of the right hand and arm.  There is no discoloration.  The right arm is slightly warmer than the left.  Right shoulder is tender along the subacromial space as well as interior aspect along the biceps tendons.  She had diffuse soft tissue tenderness throughout the cervical, paraspinals, trapezius, sternocleidomastoid, rhomboids, latissimus dorsi, and rotator cuff musculature.  Right scapula may been a bit more protracted than the left.  I did not palpate any focal trigger points.  Neck was tender with range of motion in all positions.  She may have been a bit more tender with flexion and extension, although it was quite difficult to tell as she tolerated little movement.  Cognitively, she is generally is appropriate.  She was a bit anxious but overall very pleasant and cooperative.  Cranial nerve exam is intact.  HEART:  Regular. CHEST:  Clear. ABDOMEN:  Soft and nontender.  ASSESSMENT: 1. Persistent right upper extremity and shoulder girdle pain.     The patient with a history of rotator cuff and tendinopathy     with a history of 2 acromioplasties for repair as well as     improvement of retained bony fragments and bursectomy of     fibrotic bursa.  Tendons were noted to be intact both in     biceps and rotator cuff.  She has improved in her range of     motion in the right shoulder but still has persistent pain in     the shoulder itself. 2. Documented (albeit secondhand) cervical spondylosis which     certainly may be contributing to this  picture. 3. Depression. 4. Intermittent insomnia related to pain syndrome. 5. Chronic pain syndrome/cannot rule out complex regional pain     syndrome type 2.  PLAN: 1. I need to have the patient's original cervical spine films     for examination.  Certainly a facet or disk problem could be     referring pain into the affected area.  Perhaps epidural     steroid injection or medial branch blocks would be helpful     both from a treatment and diagnostic sense. 2. I would like to review EMG/nerve conduction study reports.     Given the fact that these were read as normal.  I doubt that     there is a nerve component to this.  However, she  may be     having some transient radiculitis leading to some pain. 3. Given that there is likely a chronic component to this pain     problem.  I think it would be useful to try Lyrica and we     will begin 50 mg at bedtime titrating up to t.i.d. over 10     days. 4. We will add Robaxin which she has used in the past apparently     500 mg predominantly to be used at night to help with sleep     and muscle relaxation. 5. Consider revisiting physical therapy, work on posture and     muscle balance. 6. Consider more aggressive narcotic analgesia to reduce pain     levels perhaps. 7. I will see her back in about a month's time to assess her     progress.     Ranelle Oyster, M.D. Electronically Signed    WUJ:WJXB D:  06/17/2011 14:45:40  T:  06/17/2011 16:15:18  Job #:  [id number]  cc:   Pamela Hubbard. Pamela Hubbard, M.D. Fax: [phone]

## 2011-06-18 ENCOUNTER — Telehealth: Payer: Self-pay | Admitting: Family Medicine

## 2011-06-18 NOTE — Telephone Encounter (Signed)
Spoke with patient and appointment made.  I also advised patient that if her symptoms do not improve or worsen she should go straight to the ER.

## 2011-06-18 NOTE — Telephone Encounter (Signed)
Pt is calling back . Please advise. Thanks.

## 2011-06-18 NOTE — Telephone Encounter (Signed)
Pt has a high blood pressure and can not keep anything down pt is also having hot flashes on and off today. Pt is wanting to be seen today

## 2011-06-19 ENCOUNTER — Encounter: Payer: Self-pay | Admitting: Family Medicine

## 2011-06-19 ENCOUNTER — Ambulatory Visit (INDEPENDENT_AMBULATORY_CARE_PROVIDER_SITE_OTHER): Payer: 59 | Admitting: Family Medicine

## 2011-06-19 VITALS — BP 160/92 | Temp 98.0°F | Wt 184.0 lb

## 2011-06-19 DIAGNOSIS — I10 Essential (primary) hypertension: Secondary | ICD-10-CM

## 2011-06-19 MED ORDER — LOSARTAN POTASSIUM 50 MG PO TABS: 50.0000 mg | ORAL_TABLET | Freq: Every day | ORAL | Status: DC

## 2011-06-19 NOTE — Patient Instructions (Addendum)

## 2011-06-19 NOTE — Progress Notes (Signed)
  Subjective:    Patient ID: Pamela Hubbard, female    DOB: 09-16-1960, 50 y.o.   MRN: [id number]  HPI  Elevated blood pressure. Several readings from 140/90 here. Was at rehabilitation specialist yesterday and had blood pressure 190 systolic. Occasional headaches. No chest pains or dyspnea. Never treated for hypertension. Initial blood pressure today 160/92. Patient does not use alcohol. No regular nonsteroidal use. Currently treated with Lyrica, tramadol, and Robaxin for some chronic back pain. No urinary symptoms   Review of Systems  Constitutional: Positive for fatigue.  Eyes: Negative for visual disturbance.  Respiratory: Negative for cough, chest tightness, shortness of breath and wheezing.   Cardiovascular: Negative for chest pain, palpitations and leg swelling.  Neurological: Positive for headaches. Negative for dizziness, seizures, syncope, weakness and light-headedness.       Objective:   Physical Exam  Constitutional: She is oriented to person, place, and time. She appears well-developed and well-nourished. No distress.  Neck: Neck supple. No thyromegaly present.  Cardiovascular: Normal rate and regular rhythm.   Pulmonary/Chest: Effort normal and breath sounds normal. No respiratory distress. She has no wheezes. She has no rales.  Musculoskeletal: She exhibits no edema.  Neurological: She is alert and oriented to person, place, and time.          Assessment & Plan:  Hypertension. She's had several readings from 140/90 here with recent exacerbation and a couple of readings around 160-190 systolic. Initiate losartan 50 mg daily. Reassess blood pressure one month. Try to establish more regular aerobic exercise. Work on weight loss. Sodium reduction.

## 2011-07-27 ENCOUNTER — Encounter: Payer: Self-pay | Admitting: Family Medicine

## 2011-07-27 ENCOUNTER — Encounter: Payer: 59 | Attending: Physical Medicine & Rehabilitation | Admitting: Physical Medicine & Rehabilitation

## 2011-07-27 ENCOUNTER — Ambulatory Visit (INDEPENDENT_AMBULATORY_CARE_PROVIDER_SITE_OTHER): Payer: 59 | Admitting: Family Medicine

## 2011-07-27 VITALS — BP 138/98 | Temp 98.3°F | Wt 183.0 lb

## 2011-07-27 DIAGNOSIS — M25519 Pain in unspecified shoulder: Secondary | ICD-10-CM | POA: Insufficient documentation

## 2011-07-27 DIAGNOSIS — IMO0001 Reserved for inherently not codable concepts without codable children: Secondary | ICD-10-CM

## 2011-07-27 DIAGNOSIS — I10 Essential (primary) hypertension: Secondary | ICD-10-CM | POA: Insufficient documentation

## 2011-07-27 DIAGNOSIS — M542 Cervicalgia: Secondary | ICD-10-CM | POA: Insufficient documentation

## 2011-07-27 DIAGNOSIS — R1110 Vomiting, unspecified: Secondary | ICD-10-CM

## 2011-07-27 DIAGNOSIS — F341 Dysthymic disorder: Secondary | ICD-10-CM | POA: Insufficient documentation

## 2011-07-27 DIAGNOSIS — M47812 Spondylosis without myelopathy or radiculopathy, cervical region: Secondary | ICD-10-CM

## 2011-07-27 DIAGNOSIS — M7530 Calcific tendinitis of unspecified shoulder: Secondary | ICD-10-CM

## 2011-07-27 DIAGNOSIS — M531 Cervicobrachial syndrome: Secondary | ICD-10-CM

## 2011-07-27 DIAGNOSIS — R110 Nausea: Secondary | ICD-10-CM | POA: Insufficient documentation

## 2011-07-27 LAB — BASIC METABOLIC PANEL
BUN: 16 mg/dL (ref 6–23)
Chloride: 109 mEq/L (ref 96–112)
GFR: 127.72 mL/min (ref >60.00)
Potassium: 4 mEq/L (ref 3.5–5.1)
Sodium: 145 mEq/L (ref 135–145)

## 2011-07-27 LAB — LIPASE: Lipase: 38 U/L (ref 11.0–59.0)

## 2011-07-27 LAB — CBC WITH DIFFERENTIAL/PLATELET
Basophils Absolute: 0 10*3/uL (ref 0.0–0.1)
Eosinophils Absolute: 0.1 10*3/uL (ref 0.0–0.7)
Lymphocytes Relative: 39.6 % (ref 12.0–46.0)
MCHC: 34.4 g/dL (ref 30.0–36.0)
MCV: 91.7 fl (ref 78.0–100.0)
Monocytes Absolute: 0.3 10*3/uL (ref 0.1–1.0)
Neutrophils Relative %: 50.1 % (ref 43.0–77.0)
Platelets: 185 10*3/uL (ref 150.0–400.0)
RBC: 3.83 Mil/uL — ABNORMAL LOW (ref 3.87–5.11)

## 2011-07-27 MED ORDER — METOCLOPRAMIDE HCL 10 MG PO TABS: 10.0000 mg | ORAL_TABLET | Freq: Four times a day (QID) | ORAL | Status: DC

## 2011-07-27 MED ORDER — LOSARTAN POTASSIUM 100 MG PO TABS: 100.0000 mg | ORAL_TABLET | Freq: Every day | ORAL | Status: DC

## 2011-07-27 NOTE — Progress Notes (Signed)
  Subjective:    Patient ID: Pamela Hubbard, female    DOB: December 20, 1960, 51 y.o.   MRN: [id number]  HPI  Patient seen for followup regarding the following issues  Hypertension. Recent initiation losartan 50 mg. Tolerating well. Blood pressure here of 160/100 and a round 140/100 at home.   she is having occasional headaches.  Denies medication side effects. Compliant with therapy. Weight stable.  She relates a one-month history of postprandial vomiting. Generally about 3 episodes per day. Usually 2 hours postprandial. No hematemesis. Occasional midepigastric pain. Weight is stable. Possibly worse with spicy foods. She has long history of reflux takes Nexium regularly. Ultrasound last May no gallstones. No history of peptic ulcer disease. No regular nonsteroidal use. No alcohol use. No history of diabetes.  She reports EGD approximately 2 years ago when she lived in [place] reportedly normal  Review of Systems  Constitutional: Negative for fever, chills, appetite change and unexpected weight change.  HENT: Negative for sore throat, trouble swallowing and neck pain.   Respiratory: Negative for cough and shortness of breath.   Cardiovascular: Negative for chest pain, palpitations and leg swelling.  Gastrointestinal: Positive for nausea, vomiting and abdominal pain. Negative for diarrhea, constipation and blood in stool.  Neurological: Positive for headaches. Negative for dizziness.       Objective:   Physical Exam  Constitutional: She appears well-developed and well-nourished. No distress.  HENT:  Mouth/Throat: Oropharynx is clear and moist.  Neck: Neck supple. No thyromegaly present.  Cardiovascular: Normal rate and regular rhythm.   Pulmonary/Chest: Effort normal and breath sounds normal. No respiratory distress. She has no wheezes. She has no rales.  Abdominal: Soft. Bowel sounds are normal. She exhibits no distension and no mass. There is no rebound and no guarding.       Mildly tender  midepigastric area  Musculoskeletal: She exhibits no edema.  Lymphadenopathy:    She has no cervical adenopathy.          Assessment & Plan:  #1 hypertension improved but suboptimal control.  Titrate losartan 100 mg and reassess 2 months. Continue work on weight loss #2 new problem of postprandial vomiting.  Differential includes gastroparesis, peptic disease, versus other. Recent ultrasound no gallstones.  No associated weight loss. Obtain upper GI series.  Obtain screening lab work. Set up upper GI series. Short-term trial of Reglan 10 mg 30 minutes before meals. Eat small meals. Avoid fatty foods. Consider GI referral if symptoms persist

## 2011-07-27 NOTE — Assessment & Plan Note (Signed)
Pamela Hubbard is back regarding her multiple pain issues.  I saw her on November 28, and we initiated Lyrica and Robaxin.  She had nausea with both meals and had stopped.  However, she does report over the last month that she has had persistent nausea whenever she eats and is throwing up after meals and sometimes during the meal.  She sees Dr. Caryl Never today in this regard regarding the work up.  Her pain today is 8-9/10.  Pain is stabbing and aching involving the neck and upper shoulder region.  I did not receive her nerve conduction testing.  However, I did receive her imaging reports.  There is multilevel spondylosis from C3-C7 with mild central stenosis and mild-to-moderate foraminal stenosis at C3-C4 on the left and bilaterally at the C5-C6 and C6-C7.  Her symptoms are primarily in the neck and in the right shoulder region.  She is sleeping fairly well.  She is only taking tramadol at this point with a Nexium.  REVIEW OF SYSTEMS:  Notable for depression and anxiety.  Full 12-point review is in the written health and history section, other pertinent positives are above.  SOCIAL HISTORY:  The patient is married.  She does want to get back to work and feels that this is the cause of some of her depression.  PHYSICAL EXAMINATION:  VITAL SIGNS:  Blood pressure is 130/89, pulse 70, respiratory rate is 16 and she is satting 99% on room air. GENERAL:  The patient is generally pleasant, a bit anxious.  Pupils equal, round and reactive to light.  BACK:  I examined her neck and shoulders at length today and there are multiple trigger points that I palpated over the right upper and middle trapezius into the right C5-C6 paraspinal musculature and into the right rhomboids.  Shoulder is generally tender with passive range of motion.  Exam appeared to be generally un-provocative.  She tends to sit with a head forward position.  Shoulders are internally rotated. NEUROLOGIC:  Cognitively, she is generally  intact, but difficult to keep on.  Study taken tends to bounce quickly around from topic to topic.  No gross motor or sensory changes were seen today.  Reflexes are 2+.  ASSESSMENT: 1. Persistent right cervical and shoulder girdle pain.  The patient     with multilevel cervical spondylosis on imaging and some of her     pain certainly could be referred pain related to that.  I do feel     that there is a large myofascial component also. 2. Depression with anxiety 3. Persistent nausea which is suspicious for an esophageal/dysmotility     issue.  PLAN: 1. At this point, I will hold off on any new medication  due to her GI     complaints.  This needs to be settled first.  I do think she needs     an antidepressant and something "globally" to improve pain and     mood.  I think her mood is a large part of the problem here and the     patient agrees. 2. After informed consent, we injected 5 trigger points (each with 2     cc 1% lidocaine) today including both trapezius and rhomboid areas     in the right C5-C6 cervical paraspinals.  Multiple twitch responses     were seen with injection and the patient tolerated well.  I asked     her to continue working on posture and stretching at home as she  states that she is doing.  However, in looking at the patient today     she tends to sit with poor posture quite a bit.  Her chest size     also, I believe, has a role here in some of this shoulder girdle     pain. 3. Consider cervical blocks depending on success with the above     treatments. 4. I will see her back here in about a month.     Ranelle Oyster, M.D. Electronically Signed    ZTS/MedQ D:  07/27/2011 11:21:49  T:  07/27/2011 17:37:16  Job #:  [id number]  cc:   Katy Fitch. Sypher, M.D. Fax: [phone]  Evelena Peat, M.D.

## 2011-07-28 NOTE — Progress Notes (Signed)
Quick Note:  Pt informed on home VM ______ 

## 2011-07-29 ENCOUNTER — Ambulatory Visit (HOSPITAL_COMMUNITY)
Admission: RE | Admit: 2011-07-29 | Discharge: 2011-07-29 | Disposition: A | Discharge status: Home / Self Care | Payer: 59 | Source: Ambulatory Visit | Attending: Family Medicine | Admitting: Family Medicine

## 2011-07-29 DIAGNOSIS — R1013 Epigastric pain: Secondary | ICD-10-CM | POA: Insufficient documentation

## 2011-07-29 DIAGNOSIS — R1110 Vomiting, unspecified: Secondary | ICD-10-CM | POA: Insufficient documentation

## 2011-07-30 NOTE — Progress Notes (Signed)
Quick Note:  Pt aware ______ 

## 2011-08-17 ENCOUNTER — Encounter: Payer: Self-pay | Admitting: Family Medicine

## 2011-08-17 ENCOUNTER — Ambulatory Visit (INDEPENDENT_AMBULATORY_CARE_PROVIDER_SITE_OTHER): Payer: 59 | Admitting: Family Medicine

## 2011-08-17 VITALS — BP 148/88 | Temp 98.0°F

## 2011-08-17 DIAGNOSIS — R1013 Epigastric pain: Secondary | ICD-10-CM

## 2011-08-17 DIAGNOSIS — R1110 Vomiting, unspecified: Secondary | ICD-10-CM

## 2011-08-17 NOTE — Progress Notes (Signed)
  Subjective:    Patient ID: Pamela Hubbard, female    DOB: 1960/12/21, 51 y.o.   MRN: [id number]  HPI  Persistent epigastric abdominal pain and intermittent nausea and vomiting. Refer to prior note for details. Patient has had some intermittent vomiting which is mostly postprandial. She was doing fairly well until last Wednesday when she had recurrence. Recent labs including chemistries, lipase, CBC unremarkable. Recent barium esophagram no acute abnormality. We started Reglan which has helped her vomiting but still some nausea. Patient relates EGD estimated over 2 years ago when living in [place]. No history of peptic disease. Occasional Advil use but no regular nonsteroidals. No alcohol use. No appetite or weight changes. No hematemesis. Ultrasound last May no gallstones.  Past Medical History  Diagnosis Date  . Depression   . Ulcer    Past Surgical History  Procedure Date  . Ovarian cyst surgery 2008  . Shoulder surgery 2010    injured at work    reports that she has never smoked. She does not have any smokeless tobacco history on file. Her alcohol and drug histories not on file. family history includes Cancer in her sister. Allergies  Allergen Reactions  . Hydrocodone     Nausea, GI upset      Review of Systems  Constitutional: Negative for fever, chills and unexpected weight change.  HENT: Negative for trouble swallowing.   Respiratory: Negative for shortness of breath.   Cardiovascular: Negative for chest pain.  Gastrointestinal: Positive for nausea, vomiting and abdominal pain. Negative for diarrhea, constipation, blood in stool and abdominal distention.  Neurological: Negative for dizziness and syncope.  Hematological: Negative for adenopathy.       Objective:   Physical Exam  Constitutional: She appears well-developed and well-nourished. No distress.  HENT:  Mouth/Throat: Oropharynx is clear and moist.  Neck: Neck supple.  Cardiovascular: Normal rate and regular  rhythm.   Pulmonary/Chest: Effort normal and breath sounds normal. No respiratory distress. She has no wheezes. She has no rales.  Abdominal: Soft. Bowel sounds are normal. She exhibits no distension and no mass. There is tenderness. There is no rebound and no guarding.       Mildly tender epigastric region  Musculoskeletal: She exhibits no edema.  Lymphadenopathy:    She has no cervical adenopathy.          Assessment & Plan:  Recurrent epigastric pain associated with nausea and frequent postprandial vomiting. Recent barium esophagram no acute abnormality. No evidence for acute pancreatitis. No history of peptic ulcer disease. Patient has been on Nexium without relief. Recent ultrasound no gallstones. Recommend GI referral.

## 2011-08-24 ENCOUNTER — Encounter: Payer: 59 | Attending: Neurosurgery | Admitting: Neurosurgery

## 2011-08-24 DIAGNOSIS — F3289 Other specified depressive episodes: Secondary | ICD-10-CM | POA: Insufficient documentation

## 2011-08-24 DIAGNOSIS — M542 Cervicalgia: Secondary | ICD-10-CM | POA: Insufficient documentation

## 2011-08-24 DIAGNOSIS — K3189 Other diseases of stomach and duodenum: Secondary | ICD-10-CM | POA: Insufficient documentation

## 2011-08-24 DIAGNOSIS — M549 Dorsalgia, unspecified: Secondary | ICD-10-CM | POA: Insufficient documentation

## 2011-08-24 DIAGNOSIS — R1013 Epigastric pain: Secondary | ICD-10-CM | POA: Insufficient documentation

## 2011-08-24 DIAGNOSIS — F329 Major depressive disorder, single episode, unspecified: Secondary | ICD-10-CM | POA: Insufficient documentation

## 2011-08-24 DIAGNOSIS — F411 Generalized anxiety disorder: Secondary | ICD-10-CM | POA: Insufficient documentation

## 2011-08-24 DIAGNOSIS — G894 Chronic pain syndrome: Secondary | ICD-10-CM

## 2011-08-24 DIAGNOSIS — M25519 Pain in unspecified shoulder: Secondary | ICD-10-CM | POA: Insufficient documentation

## 2011-08-25 NOTE — Assessment & Plan Note (Signed)
This is a patient of Dr. Riley Kill seen for back and upper extremity pain, multiple pain complaints.  She has somewhat of a language barrier with her [place] to [place].  She states that she stopped all her medicines due to her "head feeling big."  Rates her average pain at 7.  She does not rate her activity level.  Pain is worse in the day and night.  Sleep patterns are fair.  She does not indicate what helps or relieves her pain.  She does drive functionally.  She is unemployed.  REVIEW OF SYSTEMS:  Notable for difficulties described above as well as some GI issues, shortness of breath, trouble walking, dizziness, confusion, depression.  No suicidal thoughts.  Past medical history, social history, and family history unchanged.  PHYSICAL EXAM:  Blood pressure is 142/80, pulse 63, respirations 18, O2 saturations 99% on room air.  Motor, strength, sensation are intact. Constitutionally, she is obese.  She is alert and oriented x3.  She has a normal gait.  ASSESSMENT: 1. History of cervicalgia and right shoulder pain. 2. Depression, anxiety 3. Gastrointestinal problems, unknown etiology.  PLAN:  She states she feels like her muscles are tight.  She is not taking the Robaxin.  We will try some Flexeril 10 mg 1 p.o. t.i.d. p.r.n., 90 with no refill.  Her questions were encouraged and answered. She will follow up with Dr. Riley Kill in 3-4 weeks.     Little Bashore L. Blima Dessert     Ranelle Oyster, M.D. Electronically Signed   RLW/MedQ D:  08/24/2011 15:18:09  T:  08/25/2011 06:13:21  Job #:  [id number]

## 2011-09-25 ENCOUNTER — Encounter: Payer: Self-pay | Admitting: Physical Medicine & Rehabilitation

## 2011-09-25 ENCOUNTER — Encounter: Payer: 59 | Attending: Physical Medicine & Rehabilitation | Admitting: Physical Medicine & Rehabilitation

## 2011-09-25 DIAGNOSIS — R109 Unspecified abdominal pain: Secondary | ICD-10-CM | POA: Insufficient documentation

## 2011-09-25 DIAGNOSIS — M719 Bursopathy, unspecified: Secondary | ICD-10-CM | POA: Insufficient documentation

## 2011-09-25 DIAGNOSIS — S46909A Unspecified injury of unspecified muscle, fascia and tendon at shoulder and upper arm level, unspecified arm, initial encounter: Secondary | ICD-10-CM

## 2011-09-25 DIAGNOSIS — M25519 Pain in unspecified shoulder: Secondary | ICD-10-CM | POA: Insufficient documentation

## 2011-09-25 DIAGNOSIS — M4712 Other spondylosis with myelopathy, cervical region: Secondary | ICD-10-CM

## 2011-09-25 DIAGNOSIS — F341 Dysthymic disorder: Secondary | ICD-10-CM | POA: Insufficient documentation

## 2011-09-25 DIAGNOSIS — M67919 Unspecified disorder of synovium and tendon, unspecified shoulder: Secondary | ICD-10-CM | POA: Insufficient documentation

## 2011-09-25 DIAGNOSIS — R5381 Other malaise: Secondary | ICD-10-CM | POA: Insufficient documentation

## 2011-09-25 DIAGNOSIS — M47812 Spondylosis without myelopathy or radiculopathy, cervical region: Secondary | ICD-10-CM | POA: Insufficient documentation

## 2011-09-25 DIAGNOSIS — R269 Unspecified abnormalities of gait and mobility: Secondary | ICD-10-CM | POA: Insufficient documentation

## 2011-09-25 DIAGNOSIS — M4722 Other spondylosis with radiculopathy, cervical region: Secondary | ICD-10-CM | POA: Insufficient documentation

## 2011-09-25 DIAGNOSIS — R61 Generalized hyperhidrosis: Secondary | ICD-10-CM | POA: Insufficient documentation

## 2011-09-25 DIAGNOSIS — R42 Dizziness and giddiness: Secondary | ICD-10-CM | POA: Insufficient documentation

## 2011-09-25 DIAGNOSIS — M542 Cervicalgia: Secondary | ICD-10-CM | POA: Insufficient documentation

## 2011-09-25 DIAGNOSIS — M549 Dorsalgia, unspecified: Secondary | ICD-10-CM | POA: Insufficient documentation

## 2011-09-25 DIAGNOSIS — M7520 Bicipital tendinitis, unspecified shoulder: Secondary | ICD-10-CM

## 2011-09-25 DIAGNOSIS — R5383 Other fatigue: Secondary | ICD-10-CM | POA: Insufficient documentation

## 2011-09-25 DIAGNOSIS — S4980XA Other specified injuries of shoulder and upper arm, unspecified arm, initial encounter: Secondary | ICD-10-CM

## 2011-09-25 DIAGNOSIS — IMO0001 Reserved for inherently not codable concepts without codable children: Secondary | ICD-10-CM | POA: Insufficient documentation

## 2011-09-25 DIAGNOSIS — S46109A Unspecified injury of muscle, fascia and tendon of long head of biceps, unspecified arm, initial encounter: Secondary | ICD-10-CM

## 2011-09-25 MED ORDER — OXYCODONE-ACETAMINOPHEN 5-325 MG PO TABS: 1.0000 | ORAL_TABLET | Freq: Four times a day (QID) | ORAL | Status: AC | PRN

## 2011-09-25 MED ORDER — ESCITALOPRAM OXALATE 10 MG PO TABS: 10.0000 mg | ORAL_TABLET | Freq: Every day | ORAL | Status: DC

## 2011-09-25 NOTE — Progress Notes (Addendum)
Subjective:    Patient ID: Pamela Hubbard, female    DOB: 16-Nov-1960, 51 y.o.   MRN: [id number]  HPI [name] is back regarding her chronic neck and shoulder pain. The TPI's lasted about 1.5 weeks but pain is back. Pain never completely went away. Pain goes from neck to the right shoulder.  The shoulder also bothers her when she lifts anything up. She explained that it's often difficult to lift a pot of coffee to pour it.  She has tried using a soft collar during the day and this has helped a little.  She feels that she's weaker and that it's hard to hold her head up.   Her swallowing was never worked up.  Her family physician placed her on a medicine for her nausea which seems to be helping.  She still feels depressed.  Pain Inventory Average Pain 7 Pain Right Now 8 My pain is constant and stabbing  In the last 24 hours, has pain interfered with the following? General activity 0 Relation with others 0 Enjoyment of life 1 What TIME of day is your pain at its worst? at all hours Sleep (in general) Poor  Pain is worse with: walking, bending, sitting, inactivity and standing Pain improves with: injections Relief from Meds: 0  Mobility walk without assistance how many minutes can you walk? less than 10 ability to climb steps?  no do you drive?  yes transfers alone Do you have any goals in this area?  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  yes  Neuro/Psych weakness trouble walking dizziness confusion depression  Prior Studies Any changes since last visit?  no  Physicians involved in your care Any changes since last visit?  no Primary care [facility] Neurologist doesn't remember his name but has seen an neurologist Orthopedist Dr  Teressa Senter      Review of Systems  Constitutional: Positive for diaphoresis.       Night sweats  HENT: Negative.   Eyes: Negative.   Respiratory: Negative.   Cardiovascular: Negative.   Gastrointestinal: Positive for abdominal pain.  Genitourinary: Negative.   Musculoskeletal: Positive for back pain and gait problem.  Skin: Negative.   Neurological: Positive for dizziness and weakness.  Hematological: Negative.   Psychiatric/Behavioral: Positive for dysphoric mood.       Objective:   Physical Exam  Constitutional: She is oriented to person, place, and time. She appears well-developed.  HENT:  Head: Normocephalic.  Eyes: EOM are normal. Pupils are equal, round, and reactive to light.  Neck: Normal range of motion.  Cardiovascular: Normal rate.   Pulmonary/Chest: Effort normal.  Musculoskeletal:       Pt with pain with cervical ROM, especially with flexion more than extension. Spurlings test equivocal to positive on the right.  extensioin maneuvers equivocal at best.    Right bicep tendons tender with palpation.  Speed's test positive.    Neurological: She is alert and oriented to person, place, and time. A cranial nerve deficit is present. She displays a negative Romberg sign.       Gross weaknees throughout the right side, but a lot of this is effort and pain. DTR's are all brisk and 2+.  Sensory exam grossly intact. No muscle wasting.   Skin: Skin is  warm.          Assessment & Plan:  ASSESSMENT:  1. Persistent right cervical and shoulder girdle pain. 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 I do feel  that there is a large myofascial component also.  2. Depression with anxiety  3. Right Biceps tendonitis (both heads).  Rotator cuff component as well. PLAN:  1. Will go ahead and arrange a right C4-C5 tranforaminal ESI at [facility] 2. After informed consent, we injected  around the two right biceps tendons using 3cc 1% lidocaine and 40mg  kenalog.  -right biceps exercises were provided. 3. Will hold off on further tramadol and trial percocet for breakthrough pain. 4. I think she needs to start on an antidepressant.  Will trial lexapro 10mg  qhs. 5. F/u with me in 6 weeks.

## 2011-09-25 NOTE — Patient Instructions (Signed)
Tendinitis del tendn del bceps (distal) con rehabilitacin (Biceps Tendon Tendinitis, Distal, with Rehab) La tendinitis implica la inflamacin y Chief Technology Officer en el tendn afectado. La porcin distal del tendn del bceps (cercana al codo) es vulnerable a la tendinitis. Esta afeccin generalmente se debe a un abultamiento seo en la zona del codo (tuberosidad bicipital) lo que aumenta la friccin en el tendn. El tendn del bceps [employer] msculo del bceps a un hueso del codo y a [employer] del hombro. Es importante para una funcin [place] del codo y para volver la palma de la mano hacia arriba (supinacin). SNTOMAS  Puede sentir dolor, sensibilidad, calor y enrojecimiento en la zona anterior del codo.   Dolor al doblar el codo o al girar la palma hacia arriba, usando la [place], especialmente si se le opone una resistencia.   Ruido de "crack" (crepitacin) al mover o tocar el tendn o el codo.  CAUSAS Los sntomas se deben a la inflamacin del tendn. Las causas de la inflamacin pueden ser:  Esguince provocado por un brusco incremento en la cantidad o en la intensidad de alguna [place], o por el [place].   [place]pe directo o lesin en el codo (poco frecuente).   Uso excesivo o repetitivo de la funcin de doblar el codo o doblar la [place], particularmente al llevar la palma [place] arriba, o con la hiperextensin del codo.  LOS RIESGOS AUMENTAN CON:  Los deportes que implican un contacto o actividades en las que se deba levantar el brazo por arriba del nivel de la cabeza, as como deportes de lanzamiento, gimnasia, levantar pesas y Academic librarian.   Trabajos pesados.   Poca fuerza y flexibilidad.   No hacer un precalentamiento adecuado.   Lesin a otros ligamentos del codo.   Inmovilizacin del codo.  PREVENCIN  Precalentamiento adecuado y elongacin antes de la [place].   Permtase un tiempo de [employer].   Mantener la forma fsica:   [place],  flexibilidad y resistencia muscular.   Capacidad cardiovascular.   Aprenda y [employer].  PRONSTICO Con el tratamiento adecuado, la tendinitis del tendn del bceps se cura dentro de las 6 semanas.  COMPLICACIONES RELACIONADAS  Tiempo de curacin prolongado, si no se trata adecuadamente o no se le da el tiempo suficiente como para curarse.   Tendn crnicamente inflamado, que causa un dolor persistente que puede avanzar hacia un dolor constante y potencialmente a la ruptura del tendn.   Recurrencia de los sntomas, especialmente si la actividad se reanuda [address], con el uso excesivo o una tcnica deficiente.  TRATAMIENTO El tratamiento inicial incluye el uso de medicamentos y la aplicacin de hielo para reducir Chief Technology Officer y la inflamacin. Modifique las [employer] causen Engineer, mining, para reducir las probabilidades de que la afeccin empeore. Los ejercicios de elongacin y fortalecimiento deben realizarse para Pharmacologist uso correcto de los msculos del codo. Los ejercicios pueden Management consultant o con un terapeuta. Podrn indicarle otros tratamientos como el ultrasonido o la terapia con Airline pilot. Generalmente la ciruga no es necesaria.  MEDICAMENTOS   Si es necesaria la administracin de medicamentos para Chief Technology Officer, se recomiendan los antiinflamatorios no esteroides, como aspirina e ibuprofeno y otros calmantes menores, como acetaminofeno.   No tome medicamentos para el dolor dentro de los [address] previos a la [place].   Si su mdico lo considera necesario, Armed forces training and education officer. Utilcelos como se le indique y slo cuando lo necesite.  CALOR  Y FRO   El fro (con hielo) debe aplicarse durante 10 a 15 minutos cada 2  3 horas para reducir la inflamacin y Chief Technology Officer e inmediatamente despus de cualquier actividad que agrava los sntomas. Utilice bolsas o un masaje de hielo.   El calor puede usarse antes de Therapist, music y de las actividades de  fortalecimiento indicadas por el profesional, el fisioterapeuta o Orthoptist. Utilice una bolsa trmica o un pao hmedo.  SOLICITE ATENCIN MDICA SI:   Los sntomas empeoran o no mejoran en 2 semanas, a pesar de Medical illustrator.   Desarrolla nuevos e inexplicables sntomas. (Los medicamentos indicados en el tratamiento le ocasionan efectos secundarios).  EJERCICIOS EJERCICIOS DE AMPLITUD DE MOVIMIENTOS Y ELONGACIN - Tendinitis del tendn del bceps (distal) Estos ejercicios lo ayudarn al comienzo de la rehabilitacin. Los sntomas podrn aliviarse con o sin una asistencia adicional de su mdico, fisioterapeuta o Herbalist. Al completar estos ejercicios, recuerde:   Restaurar la flexibilidad del tejido ayuda a que las articulaciones recuperen el movimiento normal. Esto permite que el movimiento y la actividad sea ms saludables y menos dolorosos.   Para que sea efectiva, cada elongacin debe realizarse durante al menos 30 segundos.   La elongacin nunca debe ser dolorosa. Deber sentir slo un alargamiento o distensin suave del tejido que estira.  ELONGACIN - Flexores del codo  Recustese en una cama firme o mesada sobre su espalda. Asegrese que se encuentra en una posicin cmoda que le permita relajar los msculos de los brazos.   Coloque una toalla doblada debajo de la zona superior del brazo derecha / izquierdo de modo que el codo y el hombro tengan la misma altura. Extienda el brazo. El codo no debe descansar sobre la cama o la toalla.   Deje que el peso de la mano enderece el codo. Mantenga los msculos de la mano y el pecho relajados. El mdico le indicar que aumente la intensidad de la elongacin agregando un pequeo peso a la Advice worker o la mano   Mantenga esta posicin durante __________ segundos. Debe sentir un estiramiento suave en la zona interior del codo. Vuelva lentamente a la posicin inicial.  Reptalo __________ veces. Realice este ejercicio __________ veces  por da. AMPLITUD DE MOVIMIENTOS - Supinacin - Activa  Prese o sintese con los codos a los lados. Doble el codo derecha / izquierdo a 90 grados.   Gire la palma hacia arriba hasta que sienta un suave estiramiento en la parte interna de su antebrazo.   Mantenga esta posicin durante __________ segundos. Vuelva lentamente a la posicin inicial.  Reptalo __________ veces. Realice este estiramiento __________ [place] por da.  AMPLITUD DE MOVIMIENTOS - Pronacin - Activa  Prese o sintese con los codos a los lados. Doble el codo derecha / izquierdo a 90 grados.   Gire la palma hacia arriba hasta que sienta un suave estiramiento en la parte interna de su antebrazo.   Mantenga esta posicin durante __________ segundos. Vuelva lentamente a la posicin inicial.  Reptalo __________ veces. Realice este estiramiento __________ Anthoney Harada por da.  EJERCICIOS DE FORTALECIMIENTO - Tendinitis del tendn del bceps (distal) Estos ejercicios lo ayudarn al comienzo de la rehabilitacin. Los sntomas podrn aliviarse con o sin asistencia adicional de su mdico, fisioterapeuta o Herbalist. Al completar estos ejercicios, recuerde:   Los msculos pueden ganar tanto la resistencia como la fuerza que necesita para sus actividades diarias a travs de ejercicios controlados.   Realice los ejercicios como se lo indic el mdico,  el fisioterapeuta o Orthoptist. Aumente la resistencia y las repeticiones segn se le haya indicado.   Podr experimentar dolor o cansancio muscular, pero el dolor o molestia que trata de eliminar a travs de los ejercicios nunca debe empeorar. Si el dolor empeora, detngase y asegrese de que est siguiendo las directivas correctamente. Si an siente dolor luego de Education officer, environmental lo ajustes necesarios, deber discontinuar el ejercicio hasta que pueda conversar con el profesional sobre el problema.  FUERZA - Flexin del codo, isomtrica  Prese o sintese erguido [employer].  Coloque su brazo Sales executive / izquierdo para que la palma de su mano quede hacia arriba a la altura de su cintura.   Coloque la mano opuesta sobre el Product manager. Empuje suavemente hacia abajo mientras su brazo derecha / izquierdo opone resistencia. Empuje tan intensamente como pueda con ambos brazos sin causar Scientist, research (medical) ni realizar movimientos con su codo derecha / izquierdo. Mantenga esta posicin durante __________ segundos.   Libere la tensin de ambos brazos gradualmente. Permita que sus msculos se relajen completamente antes de repetir.  Reptalo __________ veces. Realice este ejercicio __________ veces por da. FUERZA - Supinadores del antebrazo  Sintese con su antebrazo derecha / izquierdo apoyado sobre una mesa, manteniendo el codo por debajo de la altura del hombro. Apoye la [employer] borde, con la palma [place].   Suavemente tome un martillo o un cucharn de sopa.   Sin mover el codo, gire lentamente la palma y la mano hacia arriba para colocar el "pulgar arriba".   Mantenga esta posicin durante __________ segundos. Vuelva lentamente a la posicin inicial.  Reptalo __________ veces. Realice este ejercicio __________ veces por da.  FUERZA - Pronadores del antebrazo  Sintese con su antebrazo derecha / izquierdo apoyado sobre una mesa, manteniendo el codo por debajo de la altura del hombro. Apoye la [employer] borde, con la palma [place].   Suavemente tome un martillo o un cucharn de sopa.   Sin mover el codo, gire lentamente la palma y la mano hacia arriba para colocar el "pulgar arriba".   Mantenga esta posicin durante __________ segundos. Vuelva lentamente a la posicin inicial.  Reptalo __________ veces. Realice este ejercicio __________ veces por da.  FUERZA - Flexores del codo, supinacin  Con una buena [place], pngase de pie o sintese en una silla firme sin apoyabrazos. Permita que su derecha / izquierdo brazo descanse a su lado con la palma  mirando hacia adelante.   Sosteniendo un peso de __________ o una banda o tubo de goma para ejercicios, lleve la mano hacia el hombro.   Deje que sus msculos controlen la resistencia mientras la mano vuelve a su lado.  Reptalo __________ veces. Realice este ejercicio __________ veces por da.  FUERZA - Flexores del codo, neutral  Con una buena [place], pngase de pie o sintese en una silla firme sin apoyabrazos. Permita que su derecha / izquierdo brazo descanse a su lado con pulgar mirando hacia adelante.   Sosteniendo un peso de __________ o una banda o tubo de goma para ejercicios, lleve la mano hacia el hombro.   Deje que sus msculos controlen la resistencia mientras la mano vuelve a su lado.  Reptalo __________ veces. Realice este ejercicio __________ veces por da.  Document Released: 04/22/2006 Document Revised: 06/25/2011 [facility] Patient Information 2012 [place], [place].

## 2011-09-28 ENCOUNTER — Ambulatory Visit: Payer: 59 | Admitting: Family Medicine

## 2011-09-28 ENCOUNTER — Telehealth: Payer: Self-pay | Admitting: Family Medicine

## 2011-09-28 NOTE — Telephone Encounter (Signed)
[address] [place], [place] [address] p. [phone] f. [phone] To: [facility] Fax: [phone] From: [facility] Date/ Time: 09/27/2011 6:36 PM Taken By: Forbes Cellar, CSR Caller: Byrd Hesselbach Facility: not collected Patient: Pamela Hubbard, Pamela Hubbard DOB: 1961/03/25 Phone: [phone] Reason for Call: See info below Regarding Appointment: Yes Appt Date: 09/28/2011 Appt Time: 4:15:00 PM Provider: Evelena Peat Reason: Details: Card is not good Outcome: Cancelled appointment in [facility] ([facility])

## 2011-10-07 ENCOUNTER — Encounter: Payer: Self-pay | Admitting: Gastroenterology

## 2011-10-19 ENCOUNTER — Telehealth: Payer: Self-pay | Admitting: *Deleted

## 2011-10-19 NOTE — Telephone Encounter (Signed)
Had injection 09/25/11. (biceps tendon) Calling complaining of headache.

## 2011-10-20 NOTE — Telephone Encounter (Signed)
Injection had nothing to do with the headache.  She has had steroids before without an issue, so i'm confident it's not a medication effect. Increase lexapro to 20mg  qhs.

## 2011-10-20 NOTE — Telephone Encounter (Signed)
LM for pt to call back.

## 2011-10-20 NOTE — Telephone Encounter (Signed)
Please advise 

## 2011-10-21 ENCOUNTER — Encounter: Payer: Self-pay | Admitting: Physical Medicine & Rehabilitation

## 2011-10-21 NOTE — Telephone Encounter (Signed)
Pt has not called back.  Closing encounter

## 2011-10-21 NOTE — Telephone Encounter (Signed)
Lm for pt to call back

## 2011-10-22 ENCOUNTER — Telehealth: Payer: Self-pay | Admitting: Family Medicine

## 2011-10-22 NOTE — Telephone Encounter (Signed)
Called to make pt aware of Dr. Lucie Leather recommendations.  Offered pt an appt and pt states she would rather wait until Monday to see Dr. Caryl Never.  Pt is aware of appt time and date. Pt aware if she gets worse to call office back and make appt with NP.

## 2011-10-22 NOTE — Telephone Encounter (Signed)
If not getting relief with Tylenol or OTC NSAIDS, needs to be seen.

## 2011-10-22 NOTE — Telephone Encounter (Signed)
Pls advise.  

## 2011-10-22 NOTE — Telephone Encounter (Signed)
Pt called and said that she has been having bad headaches for about a wk also fatigue. Pt req med to be called in to [facility] [address], [address] in [place], [place]. Pt refused to see another doctor if pcp not avail.

## 2011-10-26 ENCOUNTER — Ambulatory Visit (INDEPENDENT_AMBULATORY_CARE_PROVIDER_SITE_OTHER): Payer: 59 | Admitting: Family Medicine

## 2011-10-26 ENCOUNTER — Encounter: Payer: Self-pay | Admitting: Family Medicine

## 2011-10-26 VITALS — BP 98/60 | Temp 97.8°F | Wt 181.0 lb

## 2011-10-26 DIAGNOSIS — R1110 Vomiting, unspecified: Secondary | ICD-10-CM

## 2011-10-26 DIAGNOSIS — G44229 Chronic tension-type headache, not intractable: Secondary | ICD-10-CM

## 2011-10-26 MED ORDER — AMITRIPTYLINE HCL 10 MG PO TABS: ORAL_TABLET | ORAL | Status: DC

## 2011-10-26 NOTE — Progress Notes (Signed)
Subjective:    Patient ID: Pamela Hubbard, female    DOB: 12/23/1960, 51 y.o.   MRN: [id number]  HPI  Patient seen for the following issues  She has history of chronic back pain presents now with headaches daily for the past 2 weeks. These are bilateral and achy quality sometimes 8/10 severity. No associated nausea or vomiting. No history of migraines. Nonexertional. She has excessive fatigue. She's tried Excedrin Migraine and over the counter analgesics without much relief. No excessive caffeine use. Generally sleeping okay. She has chronic back pain followed by pain rehabilitation. Takes muscle relaxers intermittently as well as tramadol. She's tried heat without much improvement.  Other issue is some chronic abdominal pain mostly right mid abdomen lateral to umbilicus. She's had some postprandial vomiting. We ordered GI followup last appointment but not clear this was actually scheduled. Refer to prior note. She's had previous ultrasound unremarkable. Has some occasional postprandial vomiting. Labs including amylase normal. Barium esophagram unremarkable. No recent stool changes.  Symptoms are intermittent.  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  Takes Nexium regularly.  Past Medical History  Diagnosis Date  . Depression   . Ulcer   . Confusion   . Vomiting   . Weight gain   . Weakness   . Neuromuscular disorder    Past Surgical History  Procedure Date  . Ovarian cyst surgery 2008  . Shoulder surgery 2010    injured at work    reports that she has never smoked. She has never used smokeless tobacco. Her alcohol and drug histories not on file. family history includes Cancer in her sister. Allergies  Allergen Reactions  . Hydrocodone     Nausea, GI upset  . Lyrica Nausea Only  . Robaxin Nausea Only      Review of Systems  Constitutional: Negative for chills, appetite change and unexpected weight change.  HENT: Negative for trouble swallowing.   Eyes: Negative for  visual disturbance.  Respiratory: Negative for shortness of breath.   Cardiovascular: Negative for chest pain.  Gastrointestinal: Positive for nausea, vomiting and abdominal pain. Negative for blood in stool and abdominal distention.  Neurological: Positive for headaches. Negative for dizziness, syncope and weakness.  Hematological: Negative for adenopathy.  Psychiatric/Behavioral: Negative for dysphoric mood.       Objective:   Physical Exam  Constitutional: She is oriented to person, place, and time. She appears well-developed and well-nourished.  Cardiovascular: Normal rate and regular rhythm.   Pulmonary/Chest: Effort normal and breath sounds normal. No respiratory distress. She has no wheezes. She has no rales.  Abdominal: Soft. Bowel sounds are normal. She exhibits no distension and no mass. There is no rebound and no guarding.  Musculoskeletal: She exhibits no edema.  Neurological: She is alert and oriented to person, place, and time. She has normal reflexes. No cranial nerve deficit.  Psychiatric: She has a normal mood and affect. Her behavior is normal.          Assessment & Plan:  #1 headaches. Suspect chronic tension-type headaches. We discussed conservative measures including heat, muscle massage, and avoid daily use of analgesics. Start low-dose Elavil 10 mg at night after one week titrate to 2 at night if no relief. Reassess 3 weeks #2 chronic intermittent abdominal pain and postprandial vomiting. Etiology unclear. No prior gallstones. Pain location would be very unlikely for biliary colic. She has not had any gallbladder nuclear emptying studies and this might be a consideration. Prior EGD over couple years ago per  patient report. She is taking Nexium regularly without relief. GI referral

## 2011-10-28 ENCOUNTER — Encounter: Payer: Self-pay | Admitting: Gastroenterology

## 2011-11-03 ENCOUNTER — Encounter: Payer: 59 | Admitting: Physical Medicine & Rehabilitation

## 2011-11-12 ENCOUNTER — Encounter: Payer: Self-pay | Admitting: *Deleted

## 2011-11-16 ENCOUNTER — Ambulatory Visit (INDEPENDENT_AMBULATORY_CARE_PROVIDER_SITE_OTHER): Payer: 59 | Admitting: Family Medicine

## 2011-11-16 ENCOUNTER — Encounter: Payer: Self-pay | Admitting: Family Medicine

## 2011-11-16 VITALS — BP 122/88 | Temp 98.2°F | Wt 183.0 lb

## 2011-11-16 DIAGNOSIS — R110 Nausea: Secondary | ICD-10-CM

## 2011-11-16 DIAGNOSIS — R51 Headache: Secondary | ICD-10-CM

## 2011-11-16 DIAGNOSIS — G8929 Other chronic pain: Secondary | ICD-10-CM

## 2011-11-16 NOTE — Patient Instructions (Signed)
STOP amitriptyline. Start Cyclobenzaprine (Flexeril) 5 mg one at night. Touch base in 2-3 weeks if headache no better.

## 2011-11-16 NOTE — Progress Notes (Signed)
  Subjective:    Patient ID: Pamela Hubbard, female    DOB: 11/06/1960, 51 y.o.   MRN: [id number]  HPI  Patient seen for ongoing headache. Bilateral posterior occipital. Refer to prior note. We suspected chronic tension type headache and started low-dose amitriptyline. She increased to 20 mg at night and is not seeing any improvement. Headache has not changed in character. She has taken over-the-counter analgesic which combined aspirin, acetaminophen, and caffeine with mild relief. We've cautioned against regular use of analgesics. She denies specific stressors. Sleeping fairly well. She relates issues of possible short-term memory lapse. She's had recent TSH which was normal. No head injury. No focal weakness. No cerebellar symptoms and no ataxia.  She's had some ongoing postprandial nausea and intermittent abdominal pain. Refer to prior note for details. She has followup with gastroenterology tomorrow. Previous reported endoscopy about 2 years ago and she is trying to get those records.  Past Medical History  Diagnosis Date  . Depression   . Ulcer   . Confusion   . Vomiting   . Weight gain   . Weakness   . Neuromuscular disorder    Past Surgical History  Procedure Date  . Ovarian cyst surgery 2008  . Shoulder surgery 2010    injured at work    reports that she has never smoked. She has never used smokeless tobacco. Her alcohol and drug histories not on file. family history includes Breast cancer in her sister. Allergies  Allergen Reactions  . Hydrocodone     Nausea, GI upset  . Lyrica Nausea Only  . Robaxin Nausea Only     Review of Systems  Constitutional: Positive for fatigue. Negative for chills, appetite change and unexpected weight change.  HENT: Negative for hearing loss.   Eyes: Negative for visual disturbance.  Respiratory: Negative for cough and shortness of breath.   Cardiovascular: Negative for chest pain.  Genitourinary: Negative for dysuria.  Neurological:  Positive for dizziness and headaches. Negative for seizures, syncope, weakness and numbness.  Hematological: Negative for adenopathy.  Psychiatric/Behavioral: Negative for suicidal ideas and confusion.       Objective:   Physical Exam  Constitutional: She appears well-developed and well-nourished.  HENT:       Scarring both TMs but no acute change  Eyes: Pupils are equal, round, and reactive to light.  Neck: Neck supple.  Cardiovascular: Normal rate and regular rhythm.   Pulmonary/Chest: Effort normal and breath sounds normal. No respiratory distress. She has no wheezes. She has no rales.  Musculoskeletal: She exhibits no edema.  Neurological: She is alert. She has normal reflexes. No cranial nerve deficit. Coordination normal.  Psychiatric: She has a normal mood and affect. Her behavior is normal.          Assessment & Plan:  #1 headaches. Suspect tension-type headaches, chronic. Discontinue Elavil. Trial of cyclobenzaprine 5 mg each bedtime. Reviewed possible side effects. She has already tried massage therapy, topical rubs, and heat without much relief.   Touch base in 2-3 weeks if no improvement #2 chronic postprandial nausea/abdominal pain. Pending referral to gastroenterology.

## 2011-11-17 ENCOUNTER — Other Ambulatory Visit (INDEPENDENT_AMBULATORY_CARE_PROVIDER_SITE_OTHER): Payer: 59

## 2011-11-17 ENCOUNTER — Ambulatory Visit (INDEPENDENT_AMBULATORY_CARE_PROVIDER_SITE_OTHER): Payer: 59 | Admitting: Gastroenterology

## 2011-11-17 ENCOUNTER — Telehealth: Payer: Self-pay | Admitting: Family Medicine

## 2011-11-17 ENCOUNTER — Encounter: Payer: Self-pay | Admitting: Gastroenterology

## 2011-11-17 DIAGNOSIS — R109 Unspecified abdominal pain: Secondary | ICD-10-CM

## 2011-11-17 DIAGNOSIS — R112 Nausea with vomiting, unspecified: Secondary | ICD-10-CM

## 2011-11-17 DIAGNOSIS — K5900 Constipation, unspecified: Secondary | ICD-10-CM

## 2011-11-17 LAB — CBC WITH DIFFERENTIAL/PLATELET
Basophils Relative: 1.1 % (ref 0.0–3.0)
Eosinophils Absolute: 0.1 10*3/uL (ref 0.0–0.7)
HCT: 39 % (ref 36.0–46.0)
Lymphs Abs: 1.4 10*3/uL (ref 0.7–4.0)
MCHC: 33.8 g/dL (ref 30.0–36.0)
MCV: 92.6 fl (ref 78.0–100.0)
Monocytes Absolute: 0.3 10*3/uL (ref 0.1–1.0)
Neutrophils Relative %: 47.5 % (ref 43.0–77.0)
Platelets: 157 10*3/uL (ref 150.0–400.0)

## 2011-11-17 LAB — BASIC METABOLIC PANEL
CO2: 29 mEq/L (ref 19–32)
Chloride: 108 mEq/L (ref 96–112)
Potassium: 4.6 mEq/L (ref 3.5–5.1)
Sodium: 143 mEq/L (ref 135–145)

## 2011-11-17 LAB — FERRITIN: Ferritin: 24 ng/mL (ref 10.0–291.0)

## 2011-11-17 LAB — HEPATIC FUNCTION PANEL
ALT: 36 U/L — ABNORMAL HIGH (ref 0–35)
AST: 31 U/L (ref 0–37)
Bilirubin, Direct: 0.1 mg/dL (ref 0.0–0.3)
Total Bilirubin: 0.6 mg/dL (ref 0.3–1.2)

## 2011-11-17 LAB — IBC PANEL: Saturation Ratios: 22.1 % (ref 20.0–50.0)

## 2011-11-17 LAB — AMYLASE: Amylase: 43 U/L (ref 27–131)

## 2011-11-17 LAB — FOLATE: Folate: 16.6 ng/mL (ref >5.9)

## 2011-11-17 MED ORDER — MOVIPREP 100 G PO SOLR: 1.0000 | Freq: Once | ORAL | Status: DC

## 2011-11-17 MED ORDER — PROMETHAZINE HCL 25 MG PO TABS: 25.0000 mg | ORAL_TABLET | Freq: Four times a day (QID) | ORAL | Status: DC | PRN

## 2011-11-17 MED ORDER — CYCLOBENZAPRINE HCL 10 MG PO TABS: 10.0000 mg | ORAL_TABLET | Freq: Every day | ORAL | Status: AC | PRN

## 2011-11-17 MED ORDER — METOCLOPRAMIDE HCL 10 MG PO TABS: 10.0000 mg | ORAL_TABLET | Freq: Every day | ORAL | Status: DC

## 2011-11-17 MED ORDER — SUCRALFATE 1 G PO TABS: ORAL_TABLET | ORAL | Status: AC

## 2011-11-17 MED ORDER — ALIGN PO CAPS: 1.0000 | ORAL_CAPSULE | Freq: Every day | ORAL | Status: DC

## 2011-11-17 NOTE — Progress Notes (Signed)
History of Present Illness:  This is a 51 year old Hispanic female who presents with several years of acid reflux, also periodic nausea and vomiting refractory to prior treatment with Reglan, and vague midabdominal pain with associated gas and bloating and bowel are regularly without melena or hematochezia. She has had previous barium swallow which was normal, and may have had endoscopy in [place]. Despite these complaints she's had no anorexia weight loss, in fact has gained 20-30 pounds over the last 2 years. She has not previously been treated for H. pylori infection, given her history of peptic ulcer disease, hepatitis, pancreatitis, or gallstones. Trials of Nexium 40 mg a day reportedly have not helped her symptomatology. When she has decreased by mouth intake she will become constipated. She has various arthralgias and myalgias of her head neck and girdle area. She specifically denies melena, hematochezia, clay colored stools, dark urine, icterus, fever or chills. There is no history of alcohol, cigarette, or NSAID abuse. Her central hypertension is controlled with Cozaar 100 mg a day. Family history is noncontributory.  I have reviewed this patient's present history, medical and surgical past history, allergies and medications.     ROS: The remainder of the 10 point ROS is negative... her current cardiovascular or pulmonary complaints. She does have mild chronic depression treated with Lexapro 10 mg a day. She also currently is on Ultram 50 mg 3 times a day for her arthralgias and myalgias.     Physical Exam: Blood pressure 114/80, pulse 68 and regular, and BMI of 32.03 with weight 180 pounds. General well developed well nourished patient in no acute distress, appearing her stated age Eyes PERRLA, no icterus, fundoscopic exam per opthamologist Skin no lesions noted Neck supple, no adenopathy, no thyroid enlargement, no tenderness Chest clear to percussion and auscultation Heart no significant  murmurs, gallops or rubs noted Abdomen no hepatosplenomegaly masses or tenderness, BS normal. Previous abdominoplasty in her lower abdomen. Rectal inspection normal no fissures, or fistulae noted.  No masses or tenderness on digital exam. Stool guaiac negative. Extremities no acute joint lesions, edema, phlebitis or evidence of cellulitis. Neurologic patient oriented x 3, cranial nerves intact, no focal neurologic deficits noted. Psychological mental status normal and normal affect.  Assessment and plan: Persistent nausea and vomiting of unexplained etiology, rule out H. pylori infection, relapsing peptic ulcer disease, gastric outlet obstruction, versus partial small bowel obstruction. I've scheduled endoscopy and also colonoscopy for colon cancer screening. Labs have been ordered for review and she has been placed on a FODMAP-IBS diet, reinstitute Reglan 10 mg at bedtime, continue Nexium 40 mg a day, Carafate 1 g p.c. and each bedtime, and when necessary Phenergan 25 mg tablets as needed. She may need gastric emptying scan and repeat abdominal ultrasound exam.  No diagnosis found.

## 2011-11-17 NOTE — Patient Instructions (Addendum)
Your procedure has been scheduled for 11/25/2011, please follow the seperate instructions.  Please go to the basement today for your labs.  Your prescription(s) have been sent to you pharmacy, Phenergan, Movi Prep, Carafate, Reglan. Follow the FODMAP diet given today,  Take Carafate one tablet by mouth after meals and at bedtime.  Take Reglan one tablet by mouth at bedtime.  Take Phenergan as needed for Nausea and vomiting.

## 2011-11-17 NOTE — Telephone Encounter (Signed)
Pt called req refill of cyclobenzaprine (FLEXERIL) 5 MG tablet to [facility] on [address] in [place].

## 2011-11-19 LAB — CELIAC PANEL 10
Gliadin IgA: 6 U/mL (ref <20)
Gliadin IgG: 8.1 U/mL (ref <20)
IgA: 192 mg/dL (ref 69–380)
Tissue Transglutaminase Ab, IgA: 4.2 U/mL (ref <20)

## 2011-11-25 ENCOUNTER — Ambulatory Visit (AMBULATORY_SURGERY_CENTER): Payer: 59 | Admitting: Gastroenterology

## 2011-11-25 ENCOUNTER — Encounter: Payer: Self-pay | Admitting: Gastroenterology

## 2011-11-25 VITALS — BP 144/88 | HR 63 | Temp 96.3°F | Resp 28 | Ht 63.0 in | Wt 180.0 lb

## 2011-11-25 DIAGNOSIS — D1330 Benign neoplasm of unspecified part of small intestine: Secondary | ICD-10-CM

## 2011-11-25 DIAGNOSIS — K5900 Constipation, unspecified: Secondary | ICD-10-CM

## 2011-11-25 DIAGNOSIS — K2970 Gastritis, unspecified, without bleeding: Secondary | ICD-10-CM

## 2011-11-25 DIAGNOSIS — Z1211 Encounter for screening for malignant neoplasm of colon: Secondary | ICD-10-CM

## 2011-11-25 DIAGNOSIS — R112 Nausea with vomiting, unspecified: Secondary | ICD-10-CM

## 2011-11-25 DIAGNOSIS — R109 Unspecified abdominal pain: Secondary | ICD-10-CM

## 2011-11-25 DIAGNOSIS — K2990 Gastroduodenitis, unspecified, without bleeding: Secondary | ICD-10-CM

## 2011-11-25 MED ORDER — SODIUM CHLORIDE 0.9 % IV SOLN: 500.0000 mL | INTRAVENOUS | Status: DC

## 2011-11-25 NOTE — Op Note (Signed)
[facility] [address] [place], [place]  [address]  ENDOSCOPY PROCEDURE REPORT  PATIENT:  Pamela Hubbard, Pamela Hubbard  MR#:  [id number] BIRTHDATE:  1960-08-07, 50 yrs. old  GENDER:  female  ENDOSCOPIST:  Pamela Rea. Jarold Motto, MD, [facility] Referred by:  Evelena Peat, M.D.  PROCEDURE DATE:  11/25/2011 PROCEDURE:  EGD with biopsy, 43239, EGD with biopsy for H. pylori 84696 ASA CLASS:  Class II INDICATIONS:  nausea and vomiting, dyspepsia  MEDICATIONS:   There was residual sedation effect present from prior procedure., propofol (Diprivan) 100 mg IV TOPICAL ANESTHETIC:  DESCRIPTION OF PROCEDURE:   After the risks and benefits of the procedure were explained, informed consent was obtained.  The LB GIF-H180 [id number] endoscope was introduced through the mouth and advanced to the second portion of the duodenum.  The instrument was slowly withdrawn as the mucosa was fully examined. <<PROCEDUREIMAGES>>  Moderate gastritis was found in the bulb of the duodenum. EROSIVE ANTRAL GASTRITIS AND DUODENITIS.CLO AND SI BIOPSIES DONE.GASTRIC REGULAR BIOPSIES ALSO DONE.  Otherwise the examination was normal. The upper, middle, and distal third of the esophagus were carefully inspected and no abnormalities were noted. The z-line was well seen at the GEJ. The endoscope was pushed into the fundus which was normal including a retroflexed view. The antrum,gastric body, first and second part of the duodenum were unremarkable. Retroflexed views revealed no abnormalities.    The scope was then withdrawn from the patient and the procedure completed.  COMPLICATIONS:  None  ENDOSCOPIC IMPRESSION: 1) Moderate gastritis in the bulb of duodenum 2) Otherwise normal examination 3) Normal EGD 1.R/O CELIAC DISEASE 2.R/O H.PYLORI//NSAID INJURY WITH EROSIVE GASTRODUODENITIS. RECOMMENDATIONS: 1) Await biopsy results 2) avoid NSAIDS 3) Rx CLO if positive 4) continue PPI OV 1  MONTH.  ______________________________ Pamela Rea. Jarold Motto, MD, [facility]  CC:  n. eSIGNED:   Vania Rea. Nader Hubbard at 11/25/2011 03:41 PM  Elmer Picker, [phone]

## 2011-11-25 NOTE — Op Note (Signed)
[facility] [address] [place], [place]  [address]  COLONOSCOPY PROCEDURE REPORT  PATIENT:  Pamela Hubbard, Pamela Hubbard  MR#:  [id number] BIRTHDATE:  14-May-1961, 50 yrs. old  GENDER:  female ENDOSCOPIST:  Vania Rea. Jarold Motto, MD, [facility] REF. BY:  Evelena Peat, M.D. PROCEDURE DATE:  11/25/2011 PROCEDURE:  Average-risk screening colonoscopy G0121 ASA CLASS:  Class II INDICATIONS:  Routine Risk Screening MEDICATIONS:   propofol (Diprivan) 350 mg IV  DESCRIPTION OF PROCEDURE:   After the risks and benefits and of the procedure were explained, informed consent was obtained. Digital rectal exam was performed and revealed no abnormalities. The LB CF-H180AL [id number] endoscope was introduced through the anus and advanced to the cecum, which was identified by both the appendix and ileocecal valve.  The quality of the prep was excellent, using MoviPrep.  The instrument was then slowly withdrawn as the colon was fully examined. <<PROCEDUREIMAGES>>  FINDINGS:  No polyps or cancers were seen.  This was otherwise a normal examination of the colon.   Retroflexed views in the rectum revealed hemorrhoids.    The scope was then withdrawn from the patient and the procedure completed.  COMPLICATIONS:  None ENDOSCOPIC IMPRESSION: 1) No polyps or cancers 2) Otherwise normal examination RECOMMENDATIONS: 1) High fiber diet. 2) Continue current colorectal screening recommendations for "routine risk" patients with a repeat colonoscopy in 10 years.  REPEAT EXAM:  No  ______________________________ Vania Rea. Jarold Motto, MD, [facility]  CC:  n. eSIGNED:   Vania Rea. Korina Tretter at 11/25/2011 03:26 PM  Elmer Picker, [phone]

## 2011-11-25 NOTE — Progress Notes (Signed)
Patient did not have preoperative order for IV antibiotic SSI prophylaxis. (G8918)  Patient did not experience any of the following events: a burn prior to discharge; a fall within the facility; wrong site/side/patient/procedure/implant event; or a hospital transfer or hospital admission upon discharge from the facility. (G8907)  

## 2011-11-25 NOTE — Patient Instructions (Addendum)

## 2011-11-26 ENCOUNTER — Telehealth: Payer: Self-pay | Admitting: *Deleted

## 2011-11-26 NOTE — Telephone Encounter (Signed)
Pt advised abd ultrasound scheduled for 12/01/2011 at 9 am, given prep instructions and address for [facility] and number for Radiology.

## 2011-11-26 NOTE — Telephone Encounter (Signed)
Message copied by Leonette Monarch on Thu Nov 26, 2011  9:00 AM ------      Message from: Harlow Mares D      Created: Wed Nov 25, 2011  3:43 PM       Needs abd ultrasound

## 2011-11-26 NOTE — Telephone Encounter (Signed)
No answer left message to call if questions or concerns. 

## 2011-11-30 ENCOUNTER — Encounter: Payer: Self-pay | Admitting: Gastroenterology

## 2011-12-01 ENCOUNTER — Ambulatory Visit (HOSPITAL_COMMUNITY)
Admission: RE | Admit: 2011-12-01 | Discharge: 2011-12-01 | Disposition: A | Discharge status: Home / Self Care | Payer: 59 | Source: Ambulatory Visit | Attending: Gastroenterology | Admitting: Gastroenterology

## 2011-12-01 ENCOUNTER — Encounter: Payer: Self-pay | Admitting: Gastroenterology

## 2011-12-01 DIAGNOSIS — I10 Essential (primary) hypertension: Secondary | ICD-10-CM | POA: Insufficient documentation

## 2011-12-01 DIAGNOSIS — R1011 Right upper quadrant pain: Secondary | ICD-10-CM | POA: Insufficient documentation

## 2011-12-04 ENCOUNTER — Telehealth: Payer: Self-pay | Admitting: *Deleted

## 2011-12-04 NOTE — Telephone Encounter (Signed)
Scheduled pt for her f/u on 12/24/11.

## 2011-12-04 NOTE — Telephone Encounter (Signed)
lmom for pt to call back to schedule an appt. 

## 2011-12-09 ENCOUNTER — Encounter: Payer: Self-pay | Admitting: Physical Medicine & Rehabilitation

## 2011-12-09 ENCOUNTER — Encounter: Payer: 59 | Attending: Physical Medicine & Rehabilitation | Admitting: Physical Medicine & Rehabilitation

## 2011-12-09 VITALS — BP 135/85 | HR 69 | Resp 14 | Ht 63.0 in | Wt 180.0 lb

## 2011-12-09 DIAGNOSIS — F32A Depression, unspecified: Secondary | ICD-10-CM

## 2011-12-09 DIAGNOSIS — F3289 Other specified depressive episodes: Secondary | ICD-10-CM

## 2011-12-09 DIAGNOSIS — IMO0001 Reserved for inherently not codable concepts without codable children: Secondary | ICD-10-CM

## 2011-12-09 DIAGNOSIS — M25559 Pain in unspecified hip: Secondary | ICD-10-CM | POA: Insufficient documentation

## 2011-12-09 DIAGNOSIS — F329 Major depressive disorder, single episode, unspecified: Secondary | ICD-10-CM | POA: Insufficient documentation

## 2011-12-09 DIAGNOSIS — S46109A Unspecified injury of muscle, fascia and tendon of long head of biceps, unspecified arm, initial encounter: Secondary | ICD-10-CM

## 2011-12-09 DIAGNOSIS — M47812 Spondylosis without myelopathy or radiculopathy, cervical region: Secondary | ICD-10-CM | POA: Insufficient documentation

## 2011-12-09 DIAGNOSIS — M545 Low back pain, unspecified: Secondary | ICD-10-CM | POA: Insufficient documentation

## 2011-12-09 DIAGNOSIS — R51 Headache: Secondary | ICD-10-CM | POA: Insufficient documentation

## 2011-12-09 DIAGNOSIS — M25569 Pain in unspecified knee: Secondary | ICD-10-CM | POA: Insufficient documentation

## 2011-12-09 DIAGNOSIS — R52 Pain, unspecified: Secondary | ICD-10-CM | POA: Insufficient documentation

## 2011-12-09 DIAGNOSIS — S4980XA Other specified injuries of shoulder and upper arm, unspecified arm, initial encounter: Secondary | ICD-10-CM

## 2011-12-09 DIAGNOSIS — F411 Generalized anxiety disorder: Secondary | ICD-10-CM | POA: Insufficient documentation

## 2011-12-09 DIAGNOSIS — M4712 Other spondylosis with myelopathy, cervical region: Secondary | ICD-10-CM

## 2011-12-09 DIAGNOSIS — S46909A Unspecified injury of unspecified muscle, fascia and tendon at shoulder and upper arm level, unspecified arm, initial encounter: Secondary | ICD-10-CM

## 2011-12-09 DIAGNOSIS — M79609 Pain in unspecified limb: Secondary | ICD-10-CM | POA: Insufficient documentation

## 2011-12-09 DIAGNOSIS — M4722 Other spondylosis with radiculopathy, cervical region: Secondary | ICD-10-CM

## 2011-12-09 DIAGNOSIS — M542 Cervicalgia: Secondary | ICD-10-CM | POA: Insufficient documentation

## 2011-12-09 DIAGNOSIS — M25519 Pain in unspecified shoulder: Secondary | ICD-10-CM | POA: Insufficient documentation

## 2011-12-09 DIAGNOSIS — M7520 Bicipital tendinitis, unspecified shoulder: Secondary | ICD-10-CM

## 2011-12-09 MED ORDER — CELECOXIB 100 MG PO CAPS: 100.0000 mg | ORAL_CAPSULE | Freq: Two times a day (BID) | ORAL | Status: AC

## 2011-12-09 MED ORDER — OXYCODONE-ACETAMINOPHEN 5-325 MG PO TABS: 1.0000 | ORAL_TABLET | ORAL | Status: AC | PRN

## 2011-12-09 MED ORDER — VENLAFAXINE HCL 75 MG PO TABS: 75.0000 mg | ORAL_TABLET | Freq: Two times a day (BID) | ORAL | Status: DC

## 2011-12-09 MED ORDER — OXCARBAZEPINE 150 MG PO TABS: 150.0000 mg | ORAL_TABLET | Freq: Two times a day (BID) | ORAL | Status: DC

## 2011-12-09 NOTE — Progress Notes (Signed)
Subjective:    Patient ID: Pamela Hubbard, female    DOB: 31-Aug-1960, 51 y.o.   MRN: [id number]  HPI Telia is back regarding her generalized pain. She had 2 weeks of relief with the biceps injections. She had no relief at all with the cerical ESI's.  She says that the percocet helped more than the tramadol.  She has been only using one per day.   She takes tramadol for lower level pain. Today she's complaining of pain in her low back, neck, knees, and has increased headaches also.  She tells me that she tries to exercise but she feels tire most of the time. She feels that she aches all over and just feels terrible.  She says she wouldn't be out of bed except for this appointment today. She feels dizzy at times. She has stress related to bills and financial issues also. Merrilee feels that she's forgetful, and she continues to have issues with her stomach   Pain Inventory Average Pain 6 Pain Right Now 6 My pain is dull  In the last 24 hours, has pain interfered with the following? General activity 8 Relation with others 8 Enjoyment of life 8 What TIME of day is your pain at its worst? all the time Sleep (in general) Fair  Pain is worse with: walking, bending and sitting Pain improves with: rest Relief from Meds: 5  Mobility walk without assistance how many minutes can you walk? 0 ability to climb steps?  yes do you drive?  yes Do you have any goals in this area?  no  Function not employed: date last employed  Do you have any goals in this area?  yes  Neuro/Psych trouble walking dizziness confusion depression  Prior Studies Any changes since last visit?  no  Physicians involved in your care Any changes since last visit?  no        Review of Systems  Constitutional: Positive for unexpected weight change.  Neurological: Positive for dizziness.  Psychiatric/Behavioral: Positive for dysphoric mood.  All other systems reviewed and are negative.       Objective:   Physical Exam  Constitutional: She is oriented to person, place, and time. She appears well-developed and well-nourished.  HENT:  Head: Normocephalic and atraumatic.  Nose: Nose normal.  Mouth/Throat: Oropharynx is clear and moist.  Eyes: Conjunctivae and EOM are normal. Pupils are equal, round, and reactive to light.  Neck: Normal range of motion.  Cardiovascular: Normal rate and regular rhythm.   Pulmonary/Chest: Breath sounds normal. No respiratory distress. She has no wheezes.  Abdominal: Soft.  Musculoskeletal:       Diffuse tenderness from neck, elbows, shoulders, and hands to her knees, low back and hips.  Right biceps tendons remain tender. She has pain with simple ROM at the shoulder , especiall IR and ER  Posture is fair.   Neurological: She is alert and oriented to person, place, and time.  Psychiatric: Her behavior is normal. Judgment and thought content normal. Her mood appears anxious.       Slightly anxious but appropriate          Assessment & Plan:  1. Persistent right cervical and shoulder girdle pain. 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 I do feel  that there is a large myofascial component also.  2. Depression with anxiety  3. Right Biceps tendonitis (both heads). Rotator cuff component as well.  4. Likely fibromyalgia- it's becoming quite evident given her constellation of symptoms that this is the major problem here.   PLAN:  1. DC lexapro and trial effexor IR to help with daytime energy and mood 2. Ice and AROM (without resistance) for shoulders  3. Refilled percocet today #60 4. Start trileptal at night 150 mg qhs to help with sleep, pain, and headaches  5. Begin Celebrex to help with any musculoskeletal components to her pain.  Watch closely for any GI side effects. 6. Follow up with me in 2 months. Consider supplements and lab work up based on her  presentation. All questions were encouraged and answered.

## 2011-12-09 NOTE — Patient Instructions (Signed)
Start effexor today (stop lexapro)  Begin celebrex on 12/14/11  Begin trileptal on 12/19/11  USE ICE ON YOUR RIGHT SHOULDER, NOT HEAT.  WORK ON SIMPLE RANGE OF MOTION----NO WEIGHTS!   Fibromialgia (Fibromyalgia) La fibromialgia es un trastorno que con frecuencia es mal interpretado. Se asocia a dolores musculares y sensibilidad que aparece y desaparece. Generalmente se asocia a fatiga y trastornos del sueo. Aunque tiende a ser un problema crnico, la fibromialgia no pone en peligro la vida. CAUSAS La causa exacta de la fibromialgia es desconocida. Las personas que portan ciertos tipos de genes tienen ms predisposicin a Environmental education officer fibromialgia y [employer]. Ciertos factores pueden jugar un papel como desencadenantes, por ejemplo:  Trastornos de la columna vertebral.   Artritis.   Traumatismos graves y otros factores de estrs fsico.   Factores de estrs emocional.  SNTOMAS  El principal sntoma es el dolor y la rigidez en los msculos y articulaciones, que pueden variar con el [place].   Problemas con el dormir y Control and instrumentation engineer.  Otros sntomas son:  Problemas del intestino y de la vejiga.   Dolor de [place].   Problemas visuales.   Problemas de olores y ruidos.   Depresin o cambios de humor.   Dolor con Tax adviser (dismenorrea).   Sequedad en la piel y/o los ojos.  DIAGNSTICO No hay pruebas especficas para el diagnstico de la fibromialgia. [place] pueden ser diagnosticados de manera precisa por los sntomas especficos que presentan. El diagnstico se realiza descartando que existan otros motivos que puedan [employer]. TRATAMIENTO Esta enfermedad no tiene [place]. El control incluye medicamentos y un estilo de vida [place] y saludable. El objetivo es mejorar el estado fsico, disminuir el dolor y Careers information officer dormir. INSTRUCCIONES PARA EL CUIDADO DOMICILIARIO  Tome slo medicamentos de venta libre o prescriptos, segn las indicaciones del  mdico. Las pldoras para dormir, los tranquilizantes y los medicamentos para Chief Technology Officer pueden hacer que estos problemas empeoren.   El ejercicio aerbico de bajo impacto es til y se recomienda para el tratamiento de la fibromialgia. Al comenzar Brewing technologist. Al incrementar gradualmente la tolerancia, esto se supera.   Aprender tcnicas de relajacin y a controlar el estrs lo ayudar. La biorretroalimentacin, las tcnicas de imaginera visual, la hipnosis, la Chief Technology Officer, el yoga y la meditacin son todas formas de control del estrs.   Los medicamentos antiinflamatorios podrn ser de utilidad a corto plazo, al igual que la fisioterapia.   Acupuntura o tratamientos de masajes.   Medicamentos de Microbiologist de msculos por parte del profesional que lo asiste.   Evitar situaciones de estrs.   Planear un estilo de vida saludable en relacin a la dieta, el sueo, el descanso, el ejercicio y los amigos.   Descubrir y Education administrator un pasatiempo que usted disfrute.   Unirse a un grupo de apoyo de fibromialgia para Product/process development scientist, compartir ideas y consejos. Esto podra ayudarlo.  SOLICITE ATENCIN MDICA SI: No tiene buenos resultados o mejoras en su tratamiento. PARA OBTENER MS INFORMACIN National Fibromyalgia Association : www.fmaware.orgArthritis Foundation: Investment banker, operational.arthritis.org Document Released: 07/06/2005 Document Revised: 06/25/2011 [facility] Patient Information 2012 [place], [place].

## 2011-12-16 ENCOUNTER — Telehealth: Payer: Self-pay | Admitting: Physical Medicine & Rehabilitation

## 2011-12-16 MED ORDER — TRAMADOL HCL 50 MG PO TABS: 50.0000 mg | ORAL_TABLET | Freq: Three times a day (TID) | ORAL | Status: DC | PRN

## 2011-12-16 NOTE — Telephone Encounter (Signed)
Rx has been sent in, pt aware. 

## 2011-12-16 NOTE — Telephone Encounter (Signed)
Refill on Tramadol  

## 2011-12-17 ENCOUNTER — Encounter: Payer: Self-pay | Admitting: *Deleted

## 2011-12-24 ENCOUNTER — Ambulatory Visit (INDEPENDENT_AMBULATORY_CARE_PROVIDER_SITE_OTHER): Payer: 59 | Admitting: Gastroenterology

## 2011-12-24 ENCOUNTER — Encounter: Payer: Self-pay | Admitting: Gastroenterology

## 2011-12-24 VITALS — BP 110/78 | HR 64 | Ht 63.0 in | Wt 178.0 lb

## 2011-12-24 DIAGNOSIS — K2970 Gastritis, unspecified, without bleeding: Secondary | ICD-10-CM

## 2011-12-24 DIAGNOSIS — T39395A Adverse effect of other nonsteroidal anti-inflammatory drugs [NSAID], initial encounter: Secondary | ICD-10-CM

## 2011-12-24 DIAGNOSIS — K319 Disease of stomach and duodenum, unspecified: Secondary | ICD-10-CM

## 2011-12-24 DIAGNOSIS — K2990 Gastroduodenitis, unspecified, without bleeding: Secondary | ICD-10-CM

## 2011-12-24 DIAGNOSIS — K2960 Other gastritis without bleeding: Secondary | ICD-10-CM

## 2011-12-24 NOTE — Progress Notes (Signed)
History of Present Illness: This is a very nice 51 year old Hispanic female who recently had endoscopy and colonoscopy. Her colonoscopy was normal in endoscopy showed NSAID erosive gastroduodenitis. Biopsies for H. pylori were negative, and upper abdominal ultrasound exam was negative. Her intermittent nausea and vomiting has resolved, and she currently is on Nexium 40 mg a day and probiotic therapy. She continues to complain of chronic fatigue with pain in her shoulders and knees, and is under the care of Dr. Riley Kill at the pain clinic. Review of her labs shows negative rheumatoid factor, celiac anybody's, sed rate, CBC and metabolic profile. She is on Celebrex 100 mg twice a day, Flexeril, and Ultram    Current Medications, Allergies, Past Medical History, Past Surgical History, Family History and Social History were reviewed in [employer] record.   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 We will see her on a when necessary basis as needed should she have further problems. No diagnosis found.

## 2011-12-24 NOTE — Patient Instructions (Signed)
Have your pharmacy contact us when you need refills on your nexium.

## 2012-02-08 ENCOUNTER — Encounter: Payer: Self-pay | Admitting: Physical Medicine & Rehabilitation

## 2012-02-08 ENCOUNTER — Encounter: Payer: 59 | Attending: Physical Medicine & Rehabilitation | Admitting: Physical Medicine & Rehabilitation

## 2012-02-08 VITALS — BP 142/70 | HR 78 | Resp 14 | Ht 63.0 in | Wt 177.0 lb

## 2012-02-08 DIAGNOSIS — M4712 Other spondylosis with myelopathy, cervical region: Secondary | ICD-10-CM

## 2012-02-08 DIAGNOSIS — F411 Generalized anxiety disorder: Secondary | ICD-10-CM | POA: Insufficient documentation

## 2012-02-08 DIAGNOSIS — G8929 Other chronic pain: Secondary | ICD-10-CM | POA: Insufficient documentation

## 2012-02-08 DIAGNOSIS — M549 Dorsalgia, unspecified: Secondary | ICD-10-CM

## 2012-02-08 DIAGNOSIS — M25519 Pain in unspecified shoulder: Secondary | ICD-10-CM | POA: Insufficient documentation

## 2012-02-08 DIAGNOSIS — S46909A Unspecified injury of unspecified muscle, fascia and tendon at shoulder and upper arm level, unspecified arm, initial encounter: Secondary | ICD-10-CM

## 2012-02-08 DIAGNOSIS — M545 Low back pain, unspecified: Secondary | ICD-10-CM | POA: Insufficient documentation

## 2012-02-08 DIAGNOSIS — S46109A Unspecified injury of muscle, fascia and tendon of long head of biceps, unspecified arm, initial encounter: Secondary | ICD-10-CM

## 2012-02-08 DIAGNOSIS — M47816 Spondylosis without myelopathy or radiculopathy, lumbar region: Secondary | ICD-10-CM

## 2012-02-08 DIAGNOSIS — M7520 Bicipital tendinitis, unspecified shoulder: Secondary | ICD-10-CM | POA: Insufficient documentation

## 2012-02-08 DIAGNOSIS — F32A Depression, unspecified: Secondary | ICD-10-CM

## 2012-02-08 DIAGNOSIS — M47817 Spondylosis without myelopathy or radiculopathy, lumbosacral region: Secondary | ICD-10-CM

## 2012-02-08 DIAGNOSIS — F329 Major depressive disorder, single episode, unspecified: Secondary | ICD-10-CM

## 2012-02-08 DIAGNOSIS — S4980XA Other specified injuries of shoulder and upper arm, unspecified arm, initial encounter: Secondary | ICD-10-CM

## 2012-02-08 DIAGNOSIS — M542 Cervicalgia: Secondary | ICD-10-CM | POA: Insufficient documentation

## 2012-02-08 DIAGNOSIS — F3289 Other specified depressive episodes: Secondary | ICD-10-CM

## 2012-02-08 DIAGNOSIS — M4722 Other spondylosis with radiculopathy, cervical region: Secondary | ICD-10-CM

## 2012-02-08 DIAGNOSIS — IMO0001 Reserved for inherently not codable concepts without codable children: Secondary | ICD-10-CM

## 2012-02-08 MED ORDER — CELECOXIB 200 MG PO CAPS: 200.0000 mg | ORAL_CAPSULE | Freq: Two times a day (BID) | ORAL | Status: DC

## 2012-02-08 MED ORDER — TRAMADOL HCL 50 MG PO TABS: 50.0000 mg | ORAL_TABLET | Freq: Three times a day (TID) | ORAL | Status: DC | PRN

## 2012-02-08 NOTE — Patient Instructions (Signed)
TRY DHEA 25MG  FOR MUSCLE PAIN AND DAY TIME ENERGY   PLEASE CONTINUE TO WORK ON DAILY EXERCISE!!!!!!

## 2012-02-08 NOTE — Progress Notes (Signed)
Subjective:    Patient ID: Pamela Hubbard, female    DOB: 05/08/1961, 51 y.o.   MRN: [id number]  HPI  Mrs. Matuszak is back regarding her chronic, multiple pain complaints. She continues to have  Diffuse pain. She has had persistent pain in her neck radiating to both arms, left more than right. 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   She complains of increasing low back pain with radiaiton into both legs. Symptoms are worse when she walks or sits for a prolonged period of time. She is still hesitant to exercise or get out of the house because her pain typically increases.  On the positive side, her headaches have been better as well as her mood with the effexor. The trileptal has helped her sleep.   Pain Inventory Average Pain 8 Pain Right Now 8 My pain is dull  In the last 24 hours, has pain interfered with the following? General activity 0 Relation with others 0 Enjoyment of life 2 What TIME of day is your pain at its worst? all the time Sleep (in general) Fair  Pain is worse with: walking, bending, sitting and standing Pain improves with: medication Relief from Meds: 4  Mobility walk without assistance how many minutes can you walk? 5 ability to climb steps?  yes do you drive?  yes  Function not employed: date last employed   Neuro/Psych weakness numbness trouble walking dizziness confusion  Prior Studies Any changes since last visit?  no  Physicians involved in your care Any changes since last visit?  no   History reviewed. No pertinent family history. History   Social History  . Marital Status: Married    Spouse Name: N/A    Number of Children: 3  . Years of Education: N/A   Occupational History  . Housewife    Social History Main Topics  . Smoking status: Never  Smoker   . Smokeless tobacco: Never Used  . Alcohol Use: No  . Drug Use: No  . Sexually Active: None   Other Topics Concern  . None   Social History Narrative  . None   Past Surgical History  Procedure Date  . Ovarian cyst surgery 2008  . Shoulder surgery 2010    injured at work   Past Medical History  Diagnosis Date  . Depression   . Ulcer   . Confusion   . Vomiting   . Weight gain   . Weakness   . Neuromuscular disorder   . Gastritis    BP 142/70  Pulse 78  Resp 14  Ht 5\' 3"  (1.6 m)  Wt 177 lb (80.287 kg)  BMI 31.35 kg/m2  SpO2 97%     Review of Systems  Constitutional: Positive for fever.  Gastrointestinal: Positive for nausea.  Musculoskeletal: Positive for myalgias, back pain, arthralgias and gait problem.  Neurological: Positive for weakness and numbness.  Psychiatric/Behavioral: Positive for confusion.  All other systems reviewed and are negative.       Objective:   Physical Exam  Constitutional: She is oriented to person, place, and time. She appears well-developed and well-nourished.  HENT:  Head: Normocephalic and atraumatic.  Nose: Nose normal.  Mouth/Throat: Oropharynx is clear and moist.  Eyes: Conjunctivae and EOM are normal. Pupils are equal, round, and reactive to light.  Neck: Normal  range of motion.  Cardiovascular: Normal rate and regular rhythm.  Pulmonary/Chest: Breath sounds normal. No respiratory distress. She has no wheezes. Large breasts affect postre Abdominal: Soft.  Musculoskeletal:  Diffuse tenderness from neck, elbows, shoulders, and hands to her knees, low back and hips.  Back pain worse with flexion. She has tightness over the right lumbar paraspinal muscles. SLR and seated slump testing increased pain.  Cervical spine is more tender with palpation over the mid to lower segments, spurlings test equivocal on the right.  Right biceps tendons remain tender. She has pain with simple ROM at the shoulder , especiall IR and  ER  Posture is notable for a head forward position Neurological: She is alert and oriented to person, place, and time.  Psychiatric: Her behavior is normal. Judgment and thought content normal. Her mood appears anxious.  Slightly anxious but appropriate  Assessment & Plan:   1. Persistent right cervical and shoulder girdle pain. 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 I do feel  that there is a large myofascial component also.  2. Depression with anxiety  3. Right Biceps tendonitis (both heads). Rotator cuff component as well.  4. Likely fibromyalgia- it's becoming quite evident given her constellation of symptoms that this is the major problem here.  5. Increasing lumbar spine pain with radiation to both legs, ? Lumbar radic  PLAN:  1. effexor IR 75mg  bid for mood and energy 2. Arrange C7-T1 ESI to address cervical and potentially referred shoulder girdle. 3. Refilled percocet today #60  4. Continue trileptal at night 150 mg qhs  5. Increase Celebrex to 200  6. Add DHEA  7. MRI of L-spine given increased low back pain and LE paresthesias 8. Encouraged exercise as possible. Would like to get her into integrative therapies once work up and injections are completed.

## 2012-02-12 ENCOUNTER — Telehealth: Payer: Self-pay | Admitting: *Deleted

## 2012-02-12 NOTE — Telephone Encounter (Signed)
Unfortunately, given her hx of GERD, i don't think any other "cheaper" non-selective NSAIDs should be used.

## 2012-02-12 NOTE — Telephone Encounter (Signed)
Pt aware of Dr. Swartz recommendation. 

## 2012-02-12 NOTE — Telephone Encounter (Signed)
Celebrex is too expensive for the patient. Please advise.

## 2012-02-16 ENCOUNTER — Ambulatory Visit (HOSPITAL_BASED_OUTPATIENT_CLINIC_OR_DEPARTMENT_OTHER)
Admission: RE | Admit: 2012-02-16 | Discharge: 2012-02-16 | Disposition: A | Discharge status: Home / Self Care | Payer: 59 | Source: Ambulatory Visit | Attending: Physical Medicine & Rehabilitation | Admitting: Physical Medicine & Rehabilitation

## 2012-02-16 DIAGNOSIS — M51379 Other intervertebral disc degeneration, lumbosacral region without mention of lumbar back pain or lower extremity pain: Secondary | ICD-10-CM | POA: Insufficient documentation

## 2012-02-16 DIAGNOSIS — M47816 Spondylosis without myelopathy or radiculopathy, lumbar region: Secondary | ICD-10-CM

## 2012-02-16 DIAGNOSIS — M545 Low back pain, unspecified: Secondary | ICD-10-CM | POA: Insufficient documentation

## 2012-02-16 DIAGNOSIS — M5137 Other intervertebral disc degeneration, lumbosacral region: Secondary | ICD-10-CM | POA: Insufficient documentation

## 2012-02-16 DIAGNOSIS — M79609 Pain in unspecified limb: Secondary | ICD-10-CM | POA: Insufficient documentation

## 2012-02-22 ENCOUNTER — Telehealth: Payer: Self-pay | Admitting: Physical Medicine & Rehabilitation

## 2012-02-22 NOTE — Telephone Encounter (Signed)
MRI of the lumbar spine only shows facet disease in the lower segments. There is nothing to suggest nerve root compression. Would stay with conservative mgt there for now.

## 2012-02-25 ENCOUNTER — Telehealth: Payer: Self-pay | Admitting: Physical Medicine & Rehabilitation

## 2012-02-25 DIAGNOSIS — M4722 Other spondylosis with radiculopathy, cervical region: Secondary | ICD-10-CM

## 2012-02-25 DIAGNOSIS — M7520 Bicipital tendinitis, unspecified shoulder: Secondary | ICD-10-CM

## 2012-02-25 DIAGNOSIS — S46109A Unspecified injury of muscle, fascia and tendon of long head of biceps, unspecified arm, initial encounter: Secondary | ICD-10-CM

## 2012-02-25 DIAGNOSIS — IMO0001 Reserved for inherently not codable concepts without codable children: Secondary | ICD-10-CM

## 2012-02-25 DIAGNOSIS — M47816 Spondylosis without myelopathy or radiculopathy, lumbar region: Secondary | ICD-10-CM

## 2012-02-25 DIAGNOSIS — G8929 Other chronic pain: Secondary | ICD-10-CM

## 2012-02-25 MED ORDER — CELECOXIB 200 MG PO CAPS: 200.0000 mg | ORAL_CAPSULE | Freq: Two times a day (BID) | ORAL | Status: AC

## 2012-02-25 NOTE — Telephone Encounter (Signed)
Please call about meds °

## 2012-02-25 NOTE — Telephone Encounter (Signed)
She needed a refill on Celebrex. This has been sent in for her.

## 2012-03-17 ENCOUNTER — Encounter: Payer: Self-pay | Admitting: Family Medicine

## 2012-03-17 ENCOUNTER — Ambulatory Visit (INDEPENDENT_AMBULATORY_CARE_PROVIDER_SITE_OTHER): Payer: 59 | Admitting: Family Medicine

## 2012-03-17 VITALS — BP 120/82 | Temp 98.0°F | Wt 183.0 lb

## 2012-03-17 DIAGNOSIS — H6590 Unspecified nonsuppurative otitis media, unspecified ear: Secondary | ICD-10-CM

## 2012-03-17 MED ORDER — MOMETASONE FUROATE 0.1 % EX SOLN: Freq: Every day | CUTANEOUS | Status: DC

## 2012-03-17 NOTE — Patient Instructions (Addendum)
Otitis Media with Effusion  Otitis media with effusion is the presence of fluid in the middle ear. This is a common problem that often follows ear infections. It may be present for weeks or longer after the infection. Unlike an acute ear infection, otits media with effusion refers only to fluid behind the ear drum and not infection. Children with repeated ear and sinus infections and allergy problems are the most likely to get otitis media with effusion.  CAUSES   The most frequent cause of the fluid buildup is dysfunction of the eustacian tubes. These are the tubes that drain fluid in the ears to the throat.  SYMPTOMS    The main symptom of this condition is hearing loss. As a result, you or your child may:   Listen to the TV at a loud volume.   Not respond to questions.   Ask "what" often when spoken to.   There may be a sensation of fullness or pressure but usually not pain.  DIAGNOSIS    Your caregiver will diagnose this condition by examining you or your child's ears.   Your caregiver may test the pressure in you or your child's ear with a tympanometer.   A hearing test may be conducted if the problem persists.   A caregiver will want to re-evaluate the condition periodically to see if it improves.  TREATMENT    Treatment depends on the duration and the effects of the effusion.   Antibiotics, decongestants, nose drops, and cortisone-type drugs may not be helpful.   Children with persistent ear effusions may have delayed language. Children at risk for developmental delays in hearing, learning, and speech may require referral to a specialist earlier than children not at risk.   You or your child's caregiver may suggest a referral to an Ear, Nose, and Throat (ENT) surgeon for treatment. The following may help restore normal hearing:   Drainage of fluid.   Placement of ear tubes (tympanostomy tubes).   Removal of adenoids (adenoidectomy).  HOME CARE INSTRUCTIONS    Avoid second hand  smoke.   Infants who are breast fed are less likely to have this condition.   Avoid feeding infants while laying flat.   Avoid known environmental allergens.   Be sure to see a caregiver or an ENT specialist for follow up.   Avoid people who are sick.  SEEK MEDICAL CARE IF:    Hearing is not better in 3 months.   Hearing is worse.   Ear pain.   Drainage from the ear.   Dizziness.  Document Released: 08/13/2004 Document Revised: 06/25/2011 Document Reviewed: 11/26/2009  ExitCare Patient Information 2012 [employer].

## 2012-03-17 NOTE — Progress Notes (Signed)
  Subjective:    Patient ID: Pamela Hubbard, female    DOB: 16-Sep-1960, 51 y.o.   MRN: [id number]  HPI  Acute visit. Bilateral ear pain. Duration 2 weeks. Also significant itching in both canals. Occasional clear drainage. Occasional vertigo. No sore throat. No nasal congestion. No fever or chills. No hearing changes. Taken ibuprofen without relief. Also takes regular Celebrex.   Review of Systems  Constitutional: Negative for fever and chills.  HENT: Positive for ear pain. Negative for sinus pressure and ear discharge.   Neurological: Positive for dizziness. Negative for headaches.       Objective:   Physical Exam  Constitutional: She appears well-developed and well-nourished. No distress.  HENT:  Mouth/Throat: Oropharynx is clear and moist.       Small serous effusion noted right and left eardrums. No visible drainage. no perforation. Eardrum otherwise appears normal. Minimal erythema external canal bilaterally. No exudate  Cardiovascular: Normal rate and regular rhythm.   Pulmonary/Chest: Effort normal and breath sounds normal. No respiratory distress. She has no wheezes. She has no rales.          Assessment & Plan:  Small bilateral serous effusions. Reassurance. Explained medications usually not effective. Reviewed Valsalva maneuver. Elocon lotion 2 drops as needed external ear or canal daily for pruritus

## 2012-03-18 ENCOUNTER — Telehealth: Payer: Self-pay | Admitting: Physical Medicine & Rehabilitation

## 2012-03-18 NOTE — Telephone Encounter (Signed)
Patient aware script is ready for pick up and will cost $100 dollars.

## 2012-03-18 NOTE — Telephone Encounter (Signed)
Needs refill on Celebrex.  [facility] only filled for 15 days.  Needs #60 for 30 days.

## 2012-04-05 ENCOUNTER — Encounter: Payer: Self-pay | Admitting: Physical Medicine & Rehabilitation

## 2012-04-05 ENCOUNTER — Encounter: Payer: 59 | Attending: Physical Medicine & Rehabilitation | Admitting: Physical Medicine & Rehabilitation

## 2012-04-05 VITALS — BP 152/92 | HR 73 | Resp 14 | Ht 63.0 in | Wt 181.0 lb

## 2012-04-05 DIAGNOSIS — M47817 Spondylosis without myelopathy or radiculopathy, lumbosacral region: Secondary | ICD-10-CM

## 2012-04-05 DIAGNOSIS — M549 Dorsalgia, unspecified: Secondary | ICD-10-CM

## 2012-04-05 DIAGNOSIS — M4722 Other spondylosis with radiculopathy, cervical region: Secondary | ICD-10-CM

## 2012-04-05 DIAGNOSIS — IMO0001 Reserved for inherently not codable concepts without codable children: Secondary | ICD-10-CM | POA: Insufficient documentation

## 2012-04-05 DIAGNOSIS — S4980XA Other specified injuries of shoulder and upper arm, unspecified arm, initial encounter: Secondary | ICD-10-CM

## 2012-04-05 DIAGNOSIS — S46109A Unspecified injury of muscle, fascia and tendon of long head of biceps, unspecified arm, initial encounter: Secondary | ICD-10-CM

## 2012-04-05 DIAGNOSIS — F329 Major depressive disorder, single episode, unspecified: Secondary | ICD-10-CM

## 2012-04-05 DIAGNOSIS — F32A Depression, unspecified: Secondary | ICD-10-CM

## 2012-04-05 DIAGNOSIS — F3289 Other specified depressive episodes: Secondary | ICD-10-CM

## 2012-04-05 DIAGNOSIS — M542 Cervicalgia: Secondary | ICD-10-CM | POA: Insufficient documentation

## 2012-04-05 DIAGNOSIS — M25519 Pain in unspecified shoulder: Secondary | ICD-10-CM | POA: Insufficient documentation

## 2012-04-05 DIAGNOSIS — M719 Bursopathy, unspecified: Secondary | ICD-10-CM | POA: Insufficient documentation

## 2012-04-05 DIAGNOSIS — F341 Dysthymic disorder: Secondary | ICD-10-CM | POA: Insufficient documentation

## 2012-04-05 DIAGNOSIS — G8929 Other chronic pain: Secondary | ICD-10-CM

## 2012-04-05 DIAGNOSIS — M47816 Spondylosis without myelopathy or radiculopathy, lumbar region: Secondary | ICD-10-CM

## 2012-04-05 DIAGNOSIS — I10 Essential (primary) hypertension: Secondary | ICD-10-CM

## 2012-04-05 DIAGNOSIS — R51 Headache: Secondary | ICD-10-CM | POA: Insufficient documentation

## 2012-04-05 DIAGNOSIS — M47812 Spondylosis without myelopathy or radiculopathy, cervical region: Secondary | ICD-10-CM | POA: Insufficient documentation

## 2012-04-05 DIAGNOSIS — M4712 Other spondylosis with myelopathy, cervical region: Secondary | ICD-10-CM

## 2012-04-05 DIAGNOSIS — K900 Celiac disease: Secondary | ICD-10-CM | POA: Insufficient documentation

## 2012-04-05 DIAGNOSIS — M7520 Bicipital tendinitis, unspecified shoulder: Secondary | ICD-10-CM

## 2012-04-05 DIAGNOSIS — M67919 Unspecified disorder of synovium and tendon, unspecified shoulder: Secondary | ICD-10-CM | POA: Insufficient documentation

## 2012-04-05 DIAGNOSIS — S46909A Unspecified injury of unspecified muscle, fascia and tendon at shoulder and upper arm level, unspecified arm, initial encounter: Secondary | ICD-10-CM

## 2012-04-05 LAB — CBC WITH DIFFERENTIAL/PLATELET
Basophils Absolute: 0 10*3/uL (ref 0.0–0.1)
Eosinophils Relative: 4 % (ref 0–5)
HCT: 39.4 % (ref 36.0–46.0)
Hemoglobin: 13.9 g/dL (ref 12.0–15.0)
Lymphocytes Relative: 42 % (ref 12–46)
MCV: 88.5 fL (ref 78.0–100.0)
Monocytes Absolute: 0.3 10*3/uL (ref 0.1–1.0)
Monocytes Relative: 8 % (ref 3–12)
RDW: 14.2 % (ref 11.5–15.5)
WBC: 3.4 10*3/uL — ABNORMAL LOW (ref 4.0–10.5)

## 2012-04-05 MED ORDER — TRAMADOL HCL 50 MG PO TABS: 50.0000 mg | ORAL_TABLET | Freq: Three times a day (TID) | ORAL | Status: DC | PRN

## 2012-04-05 MED ORDER — CELECOXIB 200 MG PO CAPS: 200.0000 mg | ORAL_CAPSULE | Freq: Two times a day (BID) | ORAL | Status: DC

## 2012-04-05 MED ORDER — VENLAFAXINE HCL 75 MG PO TABS: 150.0000 mg | ORAL_TABLET | Freq: Two times a day (BID) | ORAL | Status: DC

## 2012-04-05 NOTE — Patient Instructions (Signed)
Fibromialgia (Fibromyalgia) La fibromialgia es un trastorno que con frecuencia es mal interpretado. Se asocia a dolores musculares y sensibilidad que aparece y desaparece. Generalmente se asocia a fatiga y trastornos del sueo. Aunque tiende a ser un problema crnico, la fibromialgia no pone en peligro la vida. CAUSAS La causa exacta de la fibromialgia es desconocida. Las personas que portan ciertos tipos de genes tienen ms predisposicin a Environmental education officer fibromialgia y [employer]. Ciertos factores pueden jugar un papel como desencadenantes, por ejemplo:  Trastornos de la columna vertebral.   Artritis.   Traumatismos graves y otros factores de estrs fsico.   Factores de estrs emocional.  SNTOMAS  El principal sntoma es el dolor y la rigidez en los msculos y articulaciones, que pueden variar con el [place].   Problemas con el dormir y Control and instrumentation engineer.  Otros sntomas son:  Problemas del intestino y de la vejiga.   Dolor de [place].   Problemas visuales.   Problemas de olores y ruidos.   Depresin o cambios de humor.   Dolor con Tax adviser (dismenorrea).   Sequedad en la piel y/o los ojos.  DIAGNSTICO No hay pruebas especficas para el diagnstico de la fibromialgia. [place] pueden ser diagnosticados de manera precisa por los sntomas especficos que presentan. El diagnstico se realiza descartando que existan otros motivos que puedan [employer]. TRATAMIENTO Esta enfermedad no tiene [place]. El control incluye medicamentos y un estilo de vida [place] y saludable. El objetivo es mejorar el estado fsico, disminuir el dolor y Careers information officer dormir. INSTRUCCIONES PARA EL CUIDADO DOMICILIARIO  Tome slo medicamentos de venta libre o prescriptos, segn las indicaciones del mdico. Las pldoras para dormir, los tranquilizantes y los medicamentos para Chief Technology Officer pueden hacer que estos problemas empeoren.   El ejercicio aerbico de bajo impacto es til y se recomienda  para el tratamiento de la fibromialgia. Al comenzar Brewing technologist. Al incrementar gradualmente la tolerancia, esto se supera.   Aprender tcnicas de relajacin y a controlar el estrs lo ayudar. La biorretroalimentacin, las tcnicas de imaginera visual, la hipnosis, la Chief Technology Officer, el yoga y la meditacin son todas formas de control del estrs.   Los medicamentos antiinflamatorios podrn ser de utilidad a corto plazo, al igual que la fisioterapia.   Acupuntura o tratamientos de masajes.   Medicamentos de Microbiologist de msculos por parte del profesional que lo asiste.   Evitar situaciones de estrs.   Planear un estilo de vida saludable en relacin a la dieta, el sueo, el descanso, el ejercicio y los amigos.   Descubrir y Education administrator un pasatiempo que usted disfrute.   Unirse a un grupo de apoyo de fibromialgia para Product/process development scientist, compartir ideas y consejos. Esto podra ayudarlo.  SOLICITE ATENCIN MDICA SI: No tiene buenos resultados o mejoras en su tratamiento. PARA OBTENER MS INFORMACIN National Fibromyalgia Association : www.fmaware.orgArthritis Foundation: Investment banker, operational.arthritis.org Document Released: 07/06/2005 Document Revised: 06/25/2011 [facility] Patient Information 2012 [place], [place].

## 2012-04-05 NOTE — Addendum Note (Signed)
Addended by: Faith Rogue T on: 04/05/2012 02:33 PM   Modules accepted: Orders

## 2012-04-05 NOTE — Progress Notes (Signed)
Subjective:    Patient ID: Pamela Hubbard, female    DOB: [birth year], 51 y.o.   MRN: [id number]  HPI  Pamela Hubbard is back regarding her mutliple pain complaints. She never received the ESI. The procedure was ordered (now twice). She complains of increasing headaches again. Her sleep in general is better. She feels that the celebrex helps somewhat with her pain as does the tramadol. The effexor has improved her mood.   She is frustrated because as a whole her pain is increasing and is all over her body including her knees, feet, low back etc.   Pain Inventory Average Pain 8 Pain Right Now 8 My pain is dull  In the last 24 hours, has pain interfered with the following? General activity 5 Relation with others 5 Enjoyment of life 5 What TIME of day is your pain at its worst? all the time Sleep (in general) Fair  Pain is worse with: walking Pain improves with: medication Relief from Meds: 5  Mobility walk without assistance how many minutes can you walk? 10  Function not employed: date last employed  I need assistance with the following:  household duties and shopping  Neuro/Psych weakness numbness trouble walking dizziness confusion depression  Prior Studies Any changes since last visit?  no  Physicians involved in your care Any changes since last visit?  no   History reviewed. No pertinent family history. History   Social History  . Marital Status: Married    Spouse Name: N/A    Number of Children: 3  . Years of Education: N/A   Occupational History  . Housewife    Social History Main Topics  . Smoking status: Never Smoker   . Smokeless tobacco: Never Used  . Alcohol Use: No  . Drug Use: No  . Sexually Active: None   Other Topics Concern  . None   Social History Narrative  . None   Past Surgical History  Procedure Date  . Ovarian cyst surgery 2008  . Shoulder surgery 2010    injured at work   Past Medical History  Diagnosis Date  . Depression   .  Ulcer   . Confusion   . Vomiting   . Weight gain   . Weakness   . Neuromuscular disorder   . Gastritis    BP 152/92  Pulse 73  Resp 14  Ht 5\' 3"  (1.6 m)  Wt 181 lb (82.101 kg)  BMI 32.06 kg/m2  SpO2 100%     Review of Systems  HENT: Positive for neck pain.   Musculoskeletal: Positive for myalgias, back pain, arthralgias and gait problem.  Neurological: Positive for weakness and numbness.  Psychiatric/Behavioral: Positive for confusion and dysphoric mood.  All other systems reviewed and are negative.       Objective:   Physical Exam  Constitutional: She is oriented to person, place, and time. She appears well-developed and well-nourished.  HENT:  Head: Normocephalic and atraumatic.  Nose: Nose normal.  Mouth/Throat: Oropharynx is clear and moist.  Eyes: Conjunctivae and EOM are normal. Pupils are equal, round, and reactive to light.  Neck: Normal range of motion.  Cardiovascular: Normal rate and regular rhythm.  Pulmonary/Chest: Breath sounds normal. No respiratory distress. She has no wheezes. Large breasts affect postre  Abdominal: Soft.  Musculoskeletal:  Diffuse tenderness from neck, elbows, shoulders, and hands to her knees, low back and hips. Back pain worse with flexion. She has tightness over the right lumbar paraspinal muscles. SLR and seated slump testing  increased pain.  Cervical spine is more tender with palpation over the mid to lower segments, spurlings test equivocal on the right and negative on the leg. She has a head forward position. She has diffuse pain to moderate pressure in the limbs, trunk, etc today.  Neurological: She is alert and oriented to person, place, and time.  Psychiatric: Her behavior is normal. Judgment and thought content normal. Her mood appears anxious.  Slightly anxious but appropriate  Assessment & Plan:   1. Persistent right cervical and shoulder girdle pain. 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  She has yet to receive the ESI 2. Depression with anxiety  3. Right Biceps tendonitis (both heads). Rotator cuff component as well. Chronic right shoulder pain 4.Fibromyalgia syndrome 5. Celiac Disease  PLAN:  1. Increase effexor IR to 150mg  bid for mood and energy  2. Arrange C7-T1 ESI to address cervical and potentially referred shoulder girdle. (this has been ordered twice and somehow she has not gone for the injection! 3. Refilled tramadol #120  50mg  4. Continue trileptal at night 150 mg qhs  5. Maintain Celebrex  200 qd 6. Ordered a battery of tests to rule out an autoimmune or an endocrine process. She has tested positive for Celiac Disease in the past and there is an overlap type syndrome with fibromyalgia which has been described. Will check ESR, RF, ANA, CMET, CBC, TFT's, B12, Vit D, Cortisol.  7.Made a referral to integrative therapies for holistic pain mgt 8. Continue activites as tolerated for now.

## 2012-04-05 NOTE — Addendum Note (Signed)
Addended by: Judd Gaudier on: 04/05/2012 12:16 PM   Modules accepted: Orders

## 2012-04-06 ENCOUNTER — Telehealth: Payer: Self-pay | Admitting: Physical Medicine & Rehabilitation

## 2012-04-06 LAB — COMPREHENSIVE METABOLIC PANEL
ALT: 23 U/L (ref 0–35)
AST: 25 U/L (ref 0–37)
Albumin: 4.8 g/dL (ref 3.5–5.2)
Alkaline Phosphatase: 76 U/L (ref 39–117)
BUN: 11 mg/dL (ref 6–23)
CO2: 27 mEq/L (ref 19–32)
Calcium: 9.3 mg/dL (ref 8.4–10.5)
Chloride: 105 mEq/L (ref 96–112)
Creat: 0.57 mg/dL (ref 0.50–1.10)
Glucose, Bld: 57 mg/dL — ABNORMAL LOW (ref 70–99)
Potassium: 5.4 mEq/L — ABNORMAL HIGH (ref 3.5–5.3)
Sodium: 142 mEq/L (ref 135–145)
Total Bilirubin: 0.4 mg/dL (ref 0.3–1.2)
Total Protein: 7.1 g/dL (ref 6.0–8.3)

## 2012-04-06 LAB — T4: T4, Total: 8.9 ug/dL (ref 5.0–12.5)

## 2012-04-06 LAB — MAGNESIUM: Magnesium: 2 mg/dL (ref 1.5–2.5)

## 2012-04-06 LAB — VITAMIN B12: Vitamin B-12: >2000 pg/mL — ABNORMAL HIGH (ref 211–911)

## 2012-04-06 LAB — CORTISOL-AM, BLOOD: Cortisol - AM: 5 ug/dL (ref 4.3–22.4)

## 2012-04-06 LAB — T3: T3, Total: 99.2 ng/dL (ref 80.0–204.0)

## 2012-04-06 NOTE — Telephone Encounter (Signed)
The patient has had imaging done of her neck. It is not in our system. We have received a copy of this imaging which i referred to in my January note. i'm not sure where that report is however.

## 2012-04-06 NOTE — Telephone Encounter (Signed)
Pt aware of lab results 

## 2012-04-06 NOTE — Telephone Encounter (Signed)
Danielle over at [facility] called in ref to the cervical injection, said they will not do the injection without previous MRI of cervical.  She called patient to see if she has ever had an MRI of the cervical and she said she had not.

## 2012-04-06 NOTE — Telephone Encounter (Signed)
All pt labs are essentially normal. Her B12 is quite high.  Is she using a supplement. Was this sample potentially hemolyzed as her potassium was elevated. I'm not aware that she is taking any potassium supplements. If she is, she needs to stop. Would follow up with her pcp in this regard in another week or 2. We could re draw here also.

## 2012-04-08 ENCOUNTER — Ambulatory Visit
Admission: RE | Admit: 2012-04-08 | Discharge: 2012-04-08 | Disposition: A | Discharge status: Home / Self Care | Payer: 59 | Source: Ambulatory Visit | Attending: Physical Medicine & Rehabilitation | Admitting: Physical Medicine & Rehabilitation

## 2012-04-08 VITALS — BP 147/76 | HR 72

## 2012-04-08 DIAGNOSIS — M4722 Other spondylosis with radiculopathy, cervical region: Secondary | ICD-10-CM

## 2012-04-08 MED ORDER — TRIAMCINOLONE ACETONIDE 40 MG/ML IJ SUSP (RADIOLOGY)
60.0000 mg | Freq: Once | INTRAMUSCULAR | Status: AC
  Administered 2012-04-08: 60 mg via EPIDURAL

## 2012-04-08 MED ORDER — IOHEXOL 300 MG/ML  SOLN
1.0000 mL | Freq: Once | INTRAMUSCULAR | Status: AC | PRN
  Administered 2012-04-08: 1 mL via EPIDURAL

## 2012-04-09 LAB — VITAMIN D 1,25 DIHYDROXY
Vitamin D 1, 25 (OH)2 Total: 77 pg/mL — ABNORMAL HIGH (ref 18–72)
Vitamin D2 1, 25 (OH)2: <8 pg/mL

## 2012-04-11 ENCOUNTER — Telehealth: Payer: Self-pay | Admitting: Physical Medicine & Rehabilitation

## 2012-04-11 ENCOUNTER — Telehealth: Payer: Self-pay | Admitting: Family Medicine

## 2012-04-11 NOTE — Telephone Encounter (Signed)
Patient called stating that she would like a referral to see someone for the pain in her ears. Please assist/ advise.

## 2012-04-11 NOTE — Telephone Encounter (Signed)
Pt had OV with Dr Caryl Never on 03-17-12, ear and throat pain

## 2012-04-11 NOTE — Telephone Encounter (Signed)
Therapy

## 2012-04-12 ENCOUNTER — Ambulatory Visit: Payer: 59 | Admitting: Physical Medicine & Rehabilitation

## 2012-04-12 NOTE — Telephone Encounter (Signed)
Current guidelines do not recommend ENT intervention (eg tubes) for serous effusion for duration of 3 weeks.  This can sometimes take months to fully resolve.  I would recommend giving this another month unless new symptoms such as hearing loss, fever, ear drainage.

## 2012-04-12 NOTE — Telephone Encounter (Signed)
LMTCB on both home and cell VM

## 2012-04-13 ENCOUNTER — Other Ambulatory Visit: Payer: Self-pay | Admitting: *Deleted

## 2012-04-13 ENCOUNTER — Telehealth: Payer: Self-pay | Admitting: Family Medicine

## 2012-04-13 ENCOUNTER — Other Ambulatory Visit: Payer: 59

## 2012-04-13 DIAGNOSIS — M7520 Bicipital tendinitis, unspecified shoulder: Secondary | ICD-10-CM

## 2012-04-13 DIAGNOSIS — F329 Major depressive disorder, single episode, unspecified: Secondary | ICD-10-CM

## 2012-04-13 DIAGNOSIS — S46109A Unspecified injury of muscle, fascia and tendon of long head of biceps, unspecified arm, initial encounter: Secondary | ICD-10-CM

## 2012-04-13 DIAGNOSIS — M4722 Other spondylosis with radiculopathy, cervical region: Secondary | ICD-10-CM

## 2012-04-13 DIAGNOSIS — F32A Depression, unspecified: Secondary | ICD-10-CM

## 2012-04-13 DIAGNOSIS — IMO0001 Reserved for inherently not codable concepts without codable children: Secondary | ICD-10-CM

## 2012-04-13 MED ORDER — OXCARBAZEPINE 150 MG PO TABS: 150.0000 mg | ORAL_TABLET | Freq: Two times a day (BID) | ORAL | Status: DC

## 2012-04-13 NOTE — Telephone Encounter (Signed)
Pamela Hubbard is wanting refill on Trileptal. She says that she takes it bid.  The last fill did say bid but you only dispensed # 30. Last note says 150mg  q hs. Can you clarify if she is to take it bid or 1 q hs?

## 2012-04-13 NOTE — Telephone Encounter (Signed)
She may take it bid. i asked her to try it qhs and said she could increase it if tolerated

## 2012-04-13 NOTE — Telephone Encounter (Signed)
Patient called stating that she would like to be referred to a ENT for the pain in her ears. Please assist.

## 2012-04-13 NOTE — Telephone Encounter (Signed)
Enora notified of refill.

## 2012-04-13 NOTE — Addendum Note (Signed)
Addended by: Doreene Eland on: 04/13/2012 01:08 PM   Modules accepted: Orders

## 2012-04-19 ENCOUNTER — Ambulatory Visit: Payer: 59 | Attending: Physical Medicine & Rehabilitation | Admitting: Rehabilitation

## 2012-04-19 DIAGNOSIS — M6281 Muscle weakness (generalized): Secondary | ICD-10-CM | POA: Insufficient documentation

## 2012-04-19 DIAGNOSIS — R262 Difficulty in walking, not elsewhere classified: Secondary | ICD-10-CM | POA: Insufficient documentation

## 2012-04-19 DIAGNOSIS — IMO0001 Reserved for inherently not codable concepts without codable children: Secondary | ICD-10-CM | POA: Insufficient documentation

## 2012-04-19 DIAGNOSIS — M2550 Pain in unspecified joint: Secondary | ICD-10-CM | POA: Insufficient documentation

## 2012-04-19 DIAGNOSIS — R293 Abnormal posture: Secondary | ICD-10-CM | POA: Insufficient documentation

## 2012-04-19 DIAGNOSIS — M2560 Stiffness of unspecified joint, not elsewhere classified: Secondary | ICD-10-CM | POA: Insufficient documentation

## 2012-04-20 ENCOUNTER — Ambulatory Visit: Payer: 59 | Admitting: Physical Therapy

## 2012-04-21 ENCOUNTER — Ambulatory Visit: Payer: 59 | Admitting: Physical Medicine & Rehabilitation

## 2012-04-22 ENCOUNTER — Ambulatory Visit: Payer: 59 | Admitting: Physical Therapy

## 2012-04-27 ENCOUNTER — Ambulatory Visit: Payer: 59 | Admitting: Physical Therapy

## 2012-04-28 ENCOUNTER — Ambulatory Visit: Payer: 59 | Admitting: Physical Therapy

## 2012-04-28 ENCOUNTER — Telehealth: Payer: Self-pay | Admitting: Family Medicine

## 2012-04-28 DIAGNOSIS — H9209 Otalgia, unspecified ear: Secondary | ICD-10-CM

## 2012-04-28 NOTE — Telephone Encounter (Signed)
Will refer to ENT.  I will set up.

## 2012-04-28 NOTE — Telephone Encounter (Signed)
Pt called re: pain in ears. Pt was in to see Dr Caryl Never about ear pain, but pain keeps coming back. Pt req to get a referral to specialist.

## 2012-04-28 NOTE — Telephone Encounter (Signed)
Please advise 

## 2012-04-29 NOTE — Telephone Encounter (Signed)
Pt informed

## 2012-05-02 ENCOUNTER — Ambulatory Visit: Payer: 59 | Admitting: Physical Therapy

## 2012-05-05 ENCOUNTER — Ambulatory Visit: Payer: 59 | Admitting: Physical Therapy

## 2012-05-10 ENCOUNTER — Ambulatory Visit: Payer: 59 | Admitting: Physical Therapy

## 2012-05-12 ENCOUNTER — Ambulatory Visit: Payer: 59 | Admitting: Physical Therapy

## 2012-05-16 ENCOUNTER — Ambulatory Visit: Payer: 59 | Admitting: Physical Therapy

## 2012-05-19 ENCOUNTER — Ambulatory Visit: Payer: 59 | Admitting: Rehabilitation

## 2012-05-23 ENCOUNTER — Encounter: Payer: Self-pay | Admitting: Physical Medicine & Rehabilitation

## 2012-05-23 ENCOUNTER — Encounter: Payer: 59 | Attending: Physical Medicine & Rehabilitation | Admitting: Physical Medicine & Rehabilitation

## 2012-05-23 VITALS — BP 169/100 | HR 85 | Ht 69.0 in | Wt 183.0 lb

## 2012-05-23 DIAGNOSIS — M4712 Other spondylosis with myelopathy, cervical region: Secondary | ICD-10-CM

## 2012-05-23 DIAGNOSIS — M19011 Primary osteoarthritis, right shoulder: Secondary | ICD-10-CM | POA: Insufficient documentation

## 2012-05-23 DIAGNOSIS — M7520 Bicipital tendinitis, unspecified shoulder: Secondary | ICD-10-CM

## 2012-05-23 DIAGNOSIS — F32A Depression, unspecified: Secondary | ICD-10-CM

## 2012-05-23 DIAGNOSIS — IMO0001 Reserved for inherently not codable concepts without codable children: Secondary | ICD-10-CM

## 2012-05-23 DIAGNOSIS — M4722 Other spondylosis with radiculopathy, cervical region: Secondary | ICD-10-CM

## 2012-05-23 DIAGNOSIS — F3289 Other specified depressive episodes: Secondary | ICD-10-CM

## 2012-05-23 DIAGNOSIS — M19019 Primary osteoarthritis, unspecified shoulder: Secondary | ICD-10-CM

## 2012-05-23 DIAGNOSIS — F329 Major depressive disorder, single episode, unspecified: Secondary | ICD-10-CM | POA: Insufficient documentation

## 2012-05-23 MED ORDER — CLONAZEPAM 0.5 MG PO TABS: 0.5000 mg | ORAL_TABLET | Freq: Two times a day (BID) | ORAL | Status: DC | PRN

## 2012-05-23 NOTE — Progress Notes (Signed)
Subjective:    Patient ID: Pamela Hubbard, female    DOB: [birth year], 51 y.o.   MRN: [id number]  HPI  Manar is back regarding her FMS. She complains of tightness in her head and upper neck. The ESI at C7-T1 has been helpful with the pain in her lower neck. She does feel that her head is "tight" and heavy at times. She saw ENT apparently, and their assessment was near normal. She may have slight allergies causing fullness and itching in her ears. Her low back is tender as well as her hips, legs, feet.  Sleep is poor. She feels that heat may help. The tramadol, flexeril,, and effexor, she feels have been helpful. The celebrex is making her nauseas. Her lab work up at last visit was unremarkable.  Overall she is feeling more and more depressed, although she feels that the effexor "has been helpful."  Integrative therapies completed a course of rehab with her on Friday, and had nothing else to offer apparently.  Latha feels that the therapy was helpful while she was getting it, but the benefits were not very long-lasting afterwards.  Pain Inventory Average Pain 7 Pain Right Now 10 My pain is constant, dull and aching  In the last 24 hours, has pain interfered with the following? General activity 7 Relation with others 7 Enjoyment of life 7 What TIME of day is your pain at its worst? all the time Sleep (in general) Fair  Pain is worse with: walking, bending, standing and some activites Pain improves with: rest and pacing activities Relief from Meds: 4  Mobility walk without assistance how many minutes can you walk? 15 ability to climb steps?  yes do you drive?  yes  Function not employed: date last employed 2009 Do you have any goals in this area?  yes  Neuro/Psych bladder control problems weakness numbness trouble walking depression anxiety  Prior Studies Any changes since last visit?  no  Physicians involved in your care Any changes since last visit?  no   History  reviewed. No pertinent family history. History   Social History  . Marital Status: Married    Spouse Name: N/A    Number of Children: 3  . Years of Education: N/A   Occupational History  . Housewife    Social History Main Topics  . Smoking status: Never Smoker   . Smokeless tobacco: Never Used  . Alcohol Use: No  . Drug Use: No  . Sexually Active: None   Other Topics Concern  . None   Social History Narrative  . None   Past Surgical History  Procedure Date  . Ovarian cyst surgery 2008  . Shoulder surgery 2010    injured at work   Past Medical History  Diagnosis Date  . Depression   . Ulcer   . Confusion   . Vomiting   . Weight gain   . Weakness   . Neuromuscular disorder   . Gastritis   . Hypertension    BP 169/100  Pulse 85  Ht 5\' 9"  (1.753 m)  Wt 183 lb (83.008 kg)  BMI 27.02 kg/m2  SpO2 100%     Review of Systems  Musculoskeletal: Positive for myalgias and arthralgias.  Neurological: Positive for weakness and numbness.  Psychiatric/Behavioral: Positive for dysphoric mood. The patient is nervous/anxious.   All other systems reviewed and are negative.       Objective:   Physical Exam Physical Exam  Constitutional: She is oriented to person, place,  and time. She appears well-developed and well-nourished.  HENT:  Head: Normocephalic and atraumatic.  Nose: Nose normal.  Mouth/Throat: Oropharynx is clear and moist.  Eyes: Conjunctivae and EOM are normal. Pupils are equal, round, and reactive to light.  Neck: Normal range of motion.  Cardiovascular: Normal rate and regular rhythm.  Pulmonary/Chest: Breath sounds normal. No respiratory distress. She has no wheezes. Large breasts affect postre  Abdominal: Soft.  Musculoskeletal:  Diffuse tenderness from neck, elbows, shoulders, and hands to her knees, low back and hips. Back pain worse with flexion. She has tightness over the right lumbar paraspinal muscles. SLR and seated slump testing increased  pain.  Cervical spine is less tender with palpation over the mid to lower segments, spurlings test equivocal on the right and negative on the leg. She still has a head forward position. There are taut bands of muscle in the upper SCM's right more than left. Upper traps were also tight today. She has diffuse pain to moderate pressure in the limbs, trunk, etc today.  Neurological: She is alert and oriented to person, place, and time.  Psychiatric: Her behavior is normal. Judgment and thought content normal. Her mood appears anxious/ depressed. She tends to quickly move from complaint to complaint, and doesn't stay on topic very long   Assessment & Plan:   1. Persistent right cervical and shoulder girdle pain. 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   2. Depression with anxiety  3. Right Biceps tendonitis (both heads). Rotator cuff component as well. Chronic right shoulder pain  4.Fibromyalgia syndrome- I certainly feel that her central pain factors as well as her mood are becoming the biggest factors here, as evidenced by the fact that our interventions are only providing temporary benefit.  Recent lab work up was unrevealing. 5. Celiac Disease     PLAN:  1.  Effexor IR to 150mg  bid for mood and energy  2.  Begin a trial of klonopin 0.25mg  starting at night. Will increase to bid if needed 3. Refilled tramadol #120 50mg  --this helps for breakthrough pian. 4. Continue trileptal at night 150 mg qhs  5. Hold celebrex due GI intolerance 6. Made a referral to Dr. Vickii Chafe to help with pain, mood, coping skills.  7.Continue with  integrative therapies for holistic pain mgt  8. Continue activites as tolerated for now. Stressed importance of posture on a daily basis.

## 2012-05-23 NOTE — Patient Instructions (Signed)
KLONOPIN--TAKE 1/2 TABLET AT NIGHT. IF THIS IS DOES NOT HELP, YOU MAY TAKE ANOTHER 1/2 TABLET IN THE MORNING.  STOP CELEBREX  CONTINUE TO WORK ON RANGE OF MOTION AND GOOD POSTURE!

## 2012-05-24 ENCOUNTER — Telehealth: Payer: Self-pay | Admitting: Physical Medicine & Rehabilitation

## 2012-05-24 NOTE — Telephone Encounter (Signed)
Meds not at [facility]

## 2012-05-24 NOTE — Telephone Encounter (Signed)
Lm for patient to call to let us know what medication she needed.

## 2012-05-25 NOTE — Telephone Encounter (Signed)
Lm for patient to call back or call pharmacy for refill requests

## 2012-06-08 ENCOUNTER — Telehealth: Payer: Self-pay

## 2012-06-08 NOTE — Telephone Encounter (Signed)
Don't recall receiving a note, and pt told me she had completed therapy. We can hold on further therapy at this time.

## 2012-06-08 NOTE — Telephone Encounter (Signed)
Therapist says patient has not made much progress.  Rehab sent note and asked for advise but none was given.  Do you want patient to continue therapy or do something different.  Please advise.

## 2012-06-08 NOTE — Telephone Encounter (Signed)
Darl Pikes informed of orders.

## 2012-07-25 ENCOUNTER — Ambulatory Visit: Payer: 59 | Admitting: Physical Medicine & Rehabilitation

## 2012-07-29 ENCOUNTER — Encounter: Payer: 59 | Attending: Physical Medicine & Rehabilitation | Admitting: Physical Medicine & Rehabilitation

## 2012-07-29 ENCOUNTER — Encounter: Payer: Self-pay | Admitting: Physical Medicine & Rehabilitation

## 2012-07-29 VITALS — BP 122/77 | HR 87 | Resp 14 | Ht 63.0 in | Wt 185.4 lb

## 2012-07-29 DIAGNOSIS — M1710 Unilateral primary osteoarthritis, unspecified knee: Secondary | ICD-10-CM

## 2012-07-29 DIAGNOSIS — M4712 Other spondylosis with myelopathy, cervical region: Secondary | ICD-10-CM

## 2012-07-29 DIAGNOSIS — IMO0001 Reserved for inherently not codable concepts without codable children: Secondary | ICD-10-CM

## 2012-07-29 DIAGNOSIS — M170 Bilateral primary osteoarthritis of knee: Secondary | ICD-10-CM

## 2012-07-29 DIAGNOSIS — M4722 Other spondylosis with radiculopathy, cervical region: Secondary | ICD-10-CM

## 2012-07-29 DIAGNOSIS — M19011 Primary osteoarthritis, right shoulder: Secondary | ICD-10-CM

## 2012-07-29 DIAGNOSIS — M549 Dorsalgia, unspecified: Secondary | ICD-10-CM

## 2012-07-29 DIAGNOSIS — M19019 Primary osteoarthritis, unspecified shoulder: Secondary | ICD-10-CM

## 2012-07-29 DIAGNOSIS — M7520 Bicipital tendinitis, unspecified shoulder: Secondary | ICD-10-CM

## 2012-07-29 DIAGNOSIS — IMO0002 Reserved for concepts with insufficient information to code with codable children: Secondary | ICD-10-CM

## 2012-07-29 DIAGNOSIS — G8929 Other chronic pain: Secondary | ICD-10-CM

## 2012-07-29 MED ORDER — DICLOFENAC SODIUM 1 % TD GEL: 1.0000 "application " | Freq: Four times a day (QID) | TRANSDERMAL | Status: DC

## 2012-07-29 MED ORDER — CLONAZEPAM 0.5 MG PO TABS: 0.5000 mg | ORAL_TABLET | Freq: Two times a day (BID) | ORAL | Status: DC

## 2012-07-29 NOTE — Patient Instructions (Signed)
PLEASE WORK ON REGULAR STRETCHING AND EXERCISE (AEROBIC EXERCISE)!!!

## 2012-07-29 NOTE — Progress Notes (Signed)
Subjective:    Patient ID: Pamela Hubbard, female    DOB: October 01, 1960, 52 y.o.   MRN: [id number]  HPI  Pamela Hubbard is back regarding her chronic pain. She continues to have pain essentially everywhere. The most prominent pain at this point is at her knees, low back and neck. She had good results with the cervical ESI but is having pain again, but it is higher, near the occiput.  In general her sleep is better, but still she is anxious at night. When she doesn't sleep her pain is subsequently worse. She worries a lot about family issues. She tries to exercise but states she hurts too much to do it regularly.   She did not see Dr. Zenda Alpers due to a $100 copay she had to pay.   Pain Inventory Average Pain 8 Pain Right Now 8 My pain is constant  In the last 24 hours, has pain interfered with the following? General activity 0 Relation with others 0 Enjoyment of life 1 What TIME of day is your pain at its worst? all Sleep (in general) Fair  Pain is worse with: walking, bending, sitting, inactivity, standing and some activites Pain improves with: therapy/exercise and pacing activities Relief from Meds: 1  Mobility ability to climb steps?  no do you drive?  yes  Function not employed: date last employed   Neuro/Psych weakness tingling trouble walking dizziness confusion depression  Prior Studies Any changes since last visit?  no  Physicians involved in your care Any changes since last visit?  no   History reviewed. No pertinent family history. History   Social History  . Marital Status: Married    Spouse Name: N/A    Number of Children: 3  . Years of Education: N/A   Occupational History  . Housewife    Social History Main Topics  . Smoking status: Never Smoker   . Smokeless tobacco: Never Used  . Alcohol Use: No  . Drug Use: No  . Sexually Active: None   Other Topics Concern  . None   Social History Narrative  . None   Past Surgical History  Procedure Date    . Ovarian cyst surgery 2008  . Shoulder surgery 2010    injured at work   Past Medical History  Diagnosis Date  . Depression   . Ulcer   . Confusion   . Vomiting   . Weight gain   . Weakness   . Neuromuscular disorder   . Gastritis   . Hypertension    BP 122/77  Pulse 87  Resp 14  Ht 5\' 3"  (1.6 m)  Wt 185 lb 6.4 oz (84.097 kg)  BMI 32.84 kg/m2  SpO2 98%   Review of Systems  Musculoskeletal: Positive for back pain and gait problem.  Neurological: Positive for dizziness and weakness.       Tingling  Psychiatric/Behavioral: Positive for confusion and dysphoric mood.       Objective:   Physical Exam  Physical Exam  Constitutional: She is oriented to person, place, and time. She appears well-developed and well-nourished.  HENT:  Head: Normocephalic and atraumatic.  Nose: Nose normal.  Mouth/Throat: Oropharynx is clear and moist.  Eyes: Conjunctivae and EOM are normal. Pupils are equal, round, and reactive to light.  Neck: Normal range of motion.  Cardiovascular: Normal rate and regular rhythm.  Pulmonary/Chest: Breath sounds normal. No respiratory distress. She has no wheezes. Large breasts affect postre  Abdominal: Soft.  Musculoskeletal:  Diffuse tenderness from neck, elbows,  shoulders, and hands to her knees, low back and hips. Back pain with flexion and extension. Facet maneuvers positive.. She has tightness over the right lumbar paraspinal muscles. SLR and seated slump testing increased pain.  Cervical spine is less tender with palpation over the mid to lower segments, spurlings test equivocal on the right and negative on the leg.--she is having pain with palpation over the upper trap, splenius capitus muscles, worse with head flexion. She still has a head forward position. She has tenderness  thoughout all ext's today. Neurological: She is alert and oriented to person, place, and time.  Psychiatric: Her behavior is normal. Judgment and thought content normal. Her  mood appears anxious/ depressed. She tends to quickly move from complaint to complaint, and doesn't stay on topic very long Assessment & Plan:   1. Persistent right cervical and shoulder girdle pain. 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  2. Depression with anxiety  3. Right Biceps tendonitis (both heads). Rotator cuff component as well. Chronic right shoulder pain  4.Fibromyalgia syndrome- I certainly feel that her central pain factors as well as her mood are becoming the biggest factors here, as evidenced by the fact that our interventions are only providing temporary benefit. Recent lab work up was unrevealing.  5. Celiac Disease  6. Bilateral Knee pain/OA   PLAN:  1. Effexor IR  150mg  bid for mood and energy  2. Continue klonopin at increased dosing of 0.5mg  bid 3. Refilled tramadol #120 50mg  -- .  4. Continue trileptal at night 150 mg qhs  5. Will initiate voltaren gel for knees, neck.  6. xrays of both knees for OA assessment 7.Pleaded with the patient to increase activity and exercise, regular stretching. Discussed increasing DHEA to 50mg  in am. We also discussed using melatonin at night to assist with sleep/pain 8. 30 minutes of face to face patient care time were spent during this visit. All questions were encouraged and answered. i will see her back in 2 months.

## 2012-08-03 ENCOUNTER — Ambulatory Visit
Admission: RE | Admit: 2012-08-03 | Discharge: 2012-08-03 | Disposition: A | Discharge status: Home / Self Care | Payer: 59 | Source: Ambulatory Visit | Attending: Physical Medicine & Rehabilitation | Admitting: Physical Medicine & Rehabilitation

## 2012-08-03 DIAGNOSIS — M170 Bilateral primary osteoarthritis of knee: Secondary | ICD-10-CM

## 2012-08-09 ENCOUNTER — Telehealth: Payer: Self-pay | Admitting: Physical Medicine & Rehabilitation

## 2012-08-09 NOTE — Telephone Encounter (Signed)
Both knee xrays are normal.

## 2012-08-09 NOTE — Telephone Encounter (Signed)
Notified patient of results 

## 2012-08-09 NOTE — Telephone Encounter (Signed)
Please let pt know that both knee xrays are normal

## 2012-08-24 ENCOUNTER — Telehealth: Payer: Self-pay | Admitting: *Deleted

## 2012-08-24 DIAGNOSIS — F329 Major depressive disorder, single episode, unspecified: Secondary | ICD-10-CM

## 2012-08-24 DIAGNOSIS — IMO0001 Reserved for inherently not codable concepts without codable children: Secondary | ICD-10-CM

## 2012-08-24 DIAGNOSIS — S46109A Unspecified injury of muscle, fascia and tendon of long head of biceps, unspecified arm, initial encounter: Secondary | ICD-10-CM

## 2012-08-24 DIAGNOSIS — M47816 Spondylosis without myelopathy or radiculopathy, lumbar region: Secondary | ICD-10-CM

## 2012-08-24 DIAGNOSIS — M7520 Bicipital tendinitis, unspecified shoulder: Secondary | ICD-10-CM

## 2012-08-24 DIAGNOSIS — M4722 Other spondylosis with radiculopathy, cervical region: Secondary | ICD-10-CM

## 2012-08-24 DIAGNOSIS — F32A Depression, unspecified: Secondary | ICD-10-CM

## 2012-08-24 DIAGNOSIS — G8929 Other chronic pain: Secondary | ICD-10-CM

## 2012-08-24 MED ORDER — TRAMADOL HCL 50 MG PO TABS: 50.0000 mg | ORAL_TABLET | Freq: Three times a day (TID) | ORAL | Status: DC | PRN

## 2012-08-24 NOTE — Telephone Encounter (Signed)
Refill tramadol

## 2012-09-26 ENCOUNTER — Telehealth: Payer: Self-pay | Admitting: Physical Medicine & Rehabilitation

## 2012-09-26 DIAGNOSIS — S46109D Unspecified injury of muscle, fascia and tendon of long head of biceps, unspecified arm, subsequent encounter: Secondary | ICD-10-CM

## 2012-09-26 DIAGNOSIS — M4722 Other spondylosis with radiculopathy, cervical region: Secondary | ICD-10-CM

## 2012-09-26 DIAGNOSIS — F329 Major depressive disorder, single episode, unspecified: Secondary | ICD-10-CM

## 2012-09-26 DIAGNOSIS — M7520 Bicipital tendinitis, unspecified shoulder: Secondary | ICD-10-CM

## 2012-09-26 DIAGNOSIS — F32A Depression, unspecified: Secondary | ICD-10-CM

## 2012-09-26 DIAGNOSIS — IMO0001 Reserved for inherently not codable concepts without codable children: Secondary | ICD-10-CM

## 2012-09-26 NOTE — Telephone Encounter (Signed)
Please advise 

## 2012-09-26 NOTE — Telephone Encounter (Signed)
Requesting refills for her Trileptal and Effexor.

## 2012-09-27 MED ORDER — OXCARBAZEPINE 150 MG PO TABS: 150.0000 mg | ORAL_TABLET | Freq: Two times a day (BID) | ORAL | Status: DC

## 2012-09-27 MED ORDER — VENLAFAXINE HCL 75 MG PO TABS: 150.0000 mg | ORAL_TABLET | Freq: Two times a day (BID) | ORAL | Status: AC

## 2012-09-27 NOTE — Telephone Encounter (Signed)
done

## 2012-09-29 ENCOUNTER — Ambulatory Visit: Payer: 59 | Admitting: Physical Medicine & Rehabilitation

## 2012-11-03 ENCOUNTER — Encounter: Payer: 59 | Attending: Physical Medicine & Rehabilitation

## 2012-11-03 ENCOUNTER — Encounter: Payer: Self-pay | Admitting: Physical Medicine & Rehabilitation

## 2012-11-03 ENCOUNTER — Ambulatory Visit (HOSPITAL_BASED_OUTPATIENT_CLINIC_OR_DEPARTMENT_OTHER): Payer: 59 | Admitting: Physical Medicine & Rehabilitation

## 2012-11-03 VITALS — BP 150/93 | HR 78 | Resp 16 | Ht 63.0 in | Wt 183.4 lb

## 2012-11-03 DIAGNOSIS — F3289 Other specified depressive episodes: Secondary | ICD-10-CM

## 2012-11-03 DIAGNOSIS — M47817 Spondylosis without myelopathy or radiculopathy, lumbosacral region: Secondary | ICD-10-CM | POA: Insufficient documentation

## 2012-11-03 DIAGNOSIS — F329 Major depressive disorder, single episode, unspecified: Secondary | ICD-10-CM

## 2012-11-03 DIAGNOSIS — M4712 Other spondylosis with myelopathy, cervical region: Secondary | ICD-10-CM

## 2012-11-03 DIAGNOSIS — M545 Low back pain, unspecified: Secondary | ICD-10-CM | POA: Insufficient documentation

## 2012-11-03 DIAGNOSIS — M47816 Spondylosis without myelopathy or radiculopathy, lumbar region: Secondary | ICD-10-CM

## 2012-11-03 DIAGNOSIS — M4722 Other spondylosis with radiculopathy, cervical region: Secondary | ICD-10-CM

## 2012-11-03 DIAGNOSIS — G8929 Other chronic pain: Secondary | ICD-10-CM

## 2012-11-03 DIAGNOSIS — M549 Dorsalgia, unspecified: Secondary | ICD-10-CM

## 2012-11-03 DIAGNOSIS — F32A Depression, unspecified: Secondary | ICD-10-CM

## 2012-11-03 DIAGNOSIS — IMO0001 Reserved for inherently not codable concepts without codable children: Secondary | ICD-10-CM

## 2012-11-03 MED ORDER — TRAMADOL HCL 50 MG PO TABS: 50.0000 mg | ORAL_TABLET | Freq: Three times a day (TID) | ORAL | Status: DC | PRN

## 2012-11-03 NOTE — Progress Notes (Signed)
  PROCEDURE RECORD [facility]   Name: Tunisha Ruland DOB:10-15-1960 MRN: [id number]  Date:11/03/2012  Physician: Claudette Laws, MD    Nurse/CMA: Shumaker RN  Allergies:  Allergies  Allergen Reactions  . Hydrocodone     Nausea, GI upset  . Methocarbamol Nausea Only  . Pregabalin Nausea Only    Consent Signed: yes  Is patient diabetic? no  CBG today?   Pregnant: no LMP: No LMP recorded. Patient is postmenopausal. (age 50-55)  Anticoagulants: no Anti-inflammatory: no Antibiotics: no  Procedure: Bilateral Medial Branch Block Position: Prone Start Time: 1:10 End Time: 1:21 Fluoro Time: 38 seconds  RN/CMA [facility] Shumaker RN    Time 12:04 1:26    BP 150/93 139/766    Pulse 78 73    Respirations 14 14    O2 Sat 99 98    S/S 6 6    Pain Level 9/10      D/C home with husband, [name], D/C instructions reviewed, and sits independently.

## 2012-11-03 NOTE — Progress Notes (Signed)
Bilateral Lumbar L3, L4  medial branch blocks and L 5 dorsal ramus injection under fluoroscopic guidance  MRI 02/16/12 L4-5: Mild hypertrophic facet disease. No focal disc protrusion,  spinal or foraminal stenosis. There is a small synovial cyst  projecting posteriorly on the left.  L5-S1: Moderate hypertrophic facet changes. No focal disc  protrusion, spinal or foraminal stenosis.   Indication: Lumbar pain which is not relieved by medication management or other conservative care and interfering with self-care and mobility.  Informed consent was obtained after describing risks and benefits of the procedure with the patient, this includes bleeding, infection, paralysis and medication side effects.  The patient wishes to proceed and has given written consent.  The patient was placed in prone position.  The lumbar area was marked and prepped with Betadine.  One mL of 1% lidocaine was injected into each of 6 areas into the skin and subcutaneous tissue.  Then a 22-gauge 3.5 in spinal needle was inserted targeting the junction of the left S1 superior articular process and sacral ala junction. Needle was advanced under fluoroscopic guidance.  Bone contact was made.  Omnipaque 180 was injected x 0.5 mL demonstrating no intravascular uptake.  Then a solution containing one mL of 4 mg per mL dexamethasone and 3 mL of 2% MPF lidocaine was injected x 0.5 mL.  Then the left L5 superior articular process in transverse process junction was targeted.  Bone contact was made.  Omnipaque 180 was injected x 0.5 mL demonstrating no intravascular uptake. Then a solution containing one mL of 4 mg per mL dexamethasone and 3 mL of 2% MPF lidocaine was injected x 0.5 mL.  Then the left L4 superior articular process in transverse process junction was targeted.  Bone contact was made.  Omnipaque 180 was injected x 0.5 mL demonstrating no intravascular uptake.  Then a solution containing one mL of 4 mg per mL dexamethasone and 3 mL  if 2% MPF lidocaine was injected x 0.5 mL.  This same procedure was performed on the right side using the same needle, technique and injectate.  Patient tolerated procedure well.  Post procedure instructions were given.

## 2012-11-03 NOTE — Patient Instructions (Signed)
Facet Block A facet block is an injection procedure used to numb nerves near a spinal joint (facet). The injection usually includes a medicine like Novacaine (anesthetic) and a steroid medicine (similar to cortisone). The injections are made directly into the facet joint of the back. They are used for patients with several types of neck or back pain problems (such as worsening arthritis or persistent pain after surgery) that have not been helped with anti-inflammatory medications, exercise programs, physical therapy, and other forms of pain management. Multiple injections may be needed depending on how many joints are involved.  A facet block procedure can be helpful with diagnosis as well as providing therapeutic pain relief. One of three things may happen after the procedure:  The pain does not go away. This can mean that the pain is probably not coming from blocked facet joints. This information is helpful with diagnosis.  The pain goes away and stays away for a few hours but the original pain comes back and does not get better again. This information is also helpful with diagnosis. It can mean that pain is probably coming from the joints; but the steroid was not helpful for longer term pain control.  The pain goes away after the block, then returns later that day, and then gets better again over the next few days. This can mean that the block was helpful for pain control and the steroid had a longer lasting effect. If there is good, lasting benefit from the injections, the block may be repeated from 3 to 5 times. If there is good relief but it is only of short-term benefit, other procedures (such as radiofrequency lesioning) may be considered.  Note: The procedure cannot be performed if you have an active infection, a lesion on or near the area of injection, flu, cold, fever, very high blood pressure or if you are on blood thinners. Please make your doctor aware of any of these conditions. This is for  your safety!  LET YOUR CAREGIVER KNOW ABOUT:   Allergies.  Medications taken including herbs, eye drops, over the counter medications, and creams  Use of steroids (by mouth or creams).  Possible pregnancy, if applicable.  Previous problems with anesthetics or Novocaine.  History of blood clots.  History of bleeding or blood problems.  Previous surgery, particularly of the neck and/or back  Other health problems. RISKS AND COMPLICATIONS These are very uncommon but include:  Bleeding.  Injury to a nerve near the injection site.  Weakness or numbness in areas controlled by nerves near the injection site.  Infection.  Pain at the site of the injection.  Temporary fluid retention in those who are prone to this problem.  Allergic reaction to anesthetics or medicines used during the procedure. Diabetics may have a temporary increase in their blood sugar after any surgical procedure, especially if steroids are used. Stinging/burning of the numbing medicine is the most uncomfortable part of the procedure; however every person's response to any procedure is individual.  BEFORE THE PROCEDURE   Your caregiver will provide instructions about stopping any medication before the procedure.  Unless advised otherwise, if the injections are in your neck, you may take your medications as usual with a sip of water but do not eat or drink for 6 hours before the procedure.  Unless advised otherwise, you may eat, drink and take your medications as usual on the day of the procedure (both before and after) if the injections are to be in your lower back.    There is no other specific preparation necessary unless advised otherwise. PROCEDURE After checking your blood pressure, the procedure will be done in the x-ray (fluoroscopy) room while lying on your stomach. For procedures in the neck, an intravenous line is usually started. The back is then cleansed with an antiseptic soap. Sterile drapes are  placed in this area. The skin is numbed with a local anesthetic. This is felt as a stinging or burning sensation. Using x-ray guidance, needles are then advanced to the appropriate locations. Once the needles are in the proper location, the anesthetic and steroid is injected through the needles and the needles are removed. The skin is then cleansed and bandages are applied. Blood pressure will be checked again, and you will be discharged to leave with your ride after your caregiver says it is okay to go.  AFTER THE PROCEDURE  You may not drive for the remainder of the day after your procedure. An adult must be present to drive you home or to go with you in a taxi or on public transportation. The procedure will be canceled if you do not have a responsible adult with you! This is for your safety.  HOME CARE INSTRUCTIONS   The bandages noted above can be removed on the morning after the procedure.  Resume medications according to your caregiver's instructions.  No heat is to be used near or over the injected area(s) for the remainder of the day.  No tub bath or soaking in water (such as a pool, jacuzzi, etc.) for the remainder of the day.  Some local tenderness may be experienced for a couple of days after the injection. Using an ice pack three or four times a day will help this.  Keep track of the amount of pain relief as well as how long the pain relief lasted. SEEK MEDICAL CARE IF:   There is drainage from the injection site.  Pain is not controlled with medications prescribed.  There is significant bleeding or swelling. SEEK IMMEDIATE MEDICAL CARE IF:   You develop a fever of 101 F (38.3 C) or greater.  Worsening pain, swelling, and/or red streaking develops in the skin around the injection site.  Severe pain develops and cannot be controlled with medications prescribed.  You develop any headache, stiff neck, nausea, vomiting, or your eyes become very sensitive to light.  Weakness  or paralysis develops in arms or legs not present before the procedure.  You develop difficulty urinating or difficulty breathing. Document Released: 11/25/2006 Document Revised: 09/28/2011 Document Reviewed: 11/15/2008 [employer] Patient Information 2013 [employer].  

## 2012-11-10 ENCOUNTER — Ambulatory Visit: Payer: 59 | Admitting: Physical Medicine & Rehabilitation

## 2012-11-30 ENCOUNTER — Encounter: Payer: Self-pay | Admitting: Physical Medicine & Rehabilitation

## 2012-11-30 ENCOUNTER — Encounter: Payer: 59 | Attending: Physical Medicine & Rehabilitation | Admitting: Physical Medicine & Rehabilitation

## 2012-11-30 VITALS — BP 136/75 | HR 75 | Resp 14 | Ht 63.0 in | Wt 182.0 lb

## 2012-11-30 DIAGNOSIS — M47817 Spondylosis without myelopathy or radiculopathy, lumbosacral region: Secondary | ICD-10-CM | POA: Insufficient documentation

## 2012-11-30 DIAGNOSIS — M7520 Bicipital tendinitis, unspecified shoulder: Secondary | ICD-10-CM

## 2012-11-30 DIAGNOSIS — M4712 Other spondylosis with myelopathy, cervical region: Secondary | ICD-10-CM

## 2012-11-30 DIAGNOSIS — M7521 Bicipital tendinitis, right shoulder: Secondary | ICD-10-CM

## 2012-11-30 DIAGNOSIS — M19019 Primary osteoarthritis, unspecified shoulder: Secondary | ICD-10-CM | POA: Insufficient documentation

## 2012-11-30 DIAGNOSIS — F329 Major depressive disorder, single episode, unspecified: Secondary | ICD-10-CM

## 2012-11-30 DIAGNOSIS — IMO0002 Reserved for concepts with insufficient information to code with codable children: Secondary | ICD-10-CM | POA: Insufficient documentation

## 2012-11-30 DIAGNOSIS — M4722 Other spondylosis with radiculopathy, cervical region: Secondary | ICD-10-CM

## 2012-11-30 DIAGNOSIS — IMO0001 Reserved for inherently not codable concepts without codable children: Secondary | ICD-10-CM

## 2012-11-30 DIAGNOSIS — M170 Bilateral primary osteoarthritis of knee: Secondary | ICD-10-CM

## 2012-11-30 DIAGNOSIS — M1710 Unilateral primary osteoarthritis, unspecified knee: Secondary | ICD-10-CM | POA: Insufficient documentation

## 2012-11-30 DIAGNOSIS — M47816 Spondylosis without myelopathy or radiculopathy, lumbar region: Secondary | ICD-10-CM

## 2012-11-30 DIAGNOSIS — F3289 Other specified depressive episodes: Secondary | ICD-10-CM | POA: Insufficient documentation

## 2012-11-30 DIAGNOSIS — F32A Depression, unspecified: Secondary | ICD-10-CM

## 2012-11-30 DIAGNOSIS — M19011 Primary osteoarthritis, right shoulder: Secondary | ICD-10-CM

## 2012-11-30 MED ORDER — OXCARBAZEPINE 300 MG PO TABS: 300.0000 mg | ORAL_TABLET | Freq: Two times a day (BID) | ORAL | Status: AC

## 2012-11-30 NOTE — Progress Notes (Signed)
Subjective:    Patient ID: Pamela Hubbard, female    DOB: 1960-08-03, 52 y.o.   MRN: [id number]  HPI  Pamela Hubbard is back regarding her chronic pain. She is seeing Dr. Teressa Senter currently for her RUE. An MRI is apparently scheduled. She remains on multivitamin and DHEA. The DHEA has helped her energy a little bit. She is on melatonin at night but it hasn't helped her sleep consistently. She typically sleeps for about 9 hours. Sometimes the pain wakes her up. Typically her right shoulder or low back awaken her.   She is tolerating the trileptal without any issues. She uses tramadol for breakthrough pain.  L4-S1 MBB were done last month by Dr. Wynn Banker and they improved her low back pain by 50-75%, but the effects only lasted for one week.  Pain is most severe when she walks or stands. She also finds it difficult to stand up.  Pain Inventory Average Pain 7 Pain Right Now 7 My pain is dull, stabbing and tingling  In the last 24 hours, has pain interfered with the following? General activity 0 Relation with others 0 Enjoyment of life 0 What TIME of day is your pain at its worst? all the time Sleep (in general) Fair  Pain is worse with: walking, bending and standing Pain improves with: rest and therapy/exercise Relief from Meds: 2  Mobility Do you have any goals in this area?  no  Function not employed: date last employed . I need assistance with the following:  dressing, meal prep, household duties and shopping  Neuro/Psych numbness trouble walking dizziness confusion depression  Prior Studies CT/MRI  Physicians involved in your care Any changes since last visit?  no   History reviewed. No pertinent family history. History   Social History  . Marital Status: Married    Spouse Name: N/A    Number of Children: 3  . Years of Education: N/A   Occupational History  . Housewife    Social History Main Topics  . Smoking status: Never Smoker   . Smokeless tobacco: Never Used  .  Alcohol Use: No  . Drug Use: No  . Sexually Active: None   Other Topics Concern  . None   Social History Narrative  . None   Past Surgical History  Procedure Laterality Date  . Ovarian cyst surgery  2008  . Shoulder surgery  2010    injured at work   Past Medical History  Diagnosis Date  . Depression   . Ulcer   . Confusion   . Vomiting   . Weight gain   . Weakness   . Neuromuscular disorder   . Gastritis   . Hypertension    BP 136/75  Pulse 75  Resp 14  Ht 5\' 3"  (1.6 m)  Wt 182 lb (82.555 kg)  BMI 32.25 kg/m2  SpO2 100%     Review of Systems  Musculoskeletal: Positive for gait problem.  Neurological: Positive for dizziness and numbness.  Psychiatric/Behavioral: Positive for confusion and dysphoric mood.  All other systems reviewed and are negative.       Objective:   Physical Exam  Constitutional: She is oriented to person, place, and time. She appears well-developed and well-nourished.  HENT:  Head: Normocephalic and atraumatic.  Nose: Nose normal.  Mouth/Throat: Oropharynx is clear and moist.  Eyes: Conjunctivae and EOM are normal. Pupils are equal, round, and reactive to light.  Neck: Normal range of motion.  Cardiovascular: Normal rate and regular rhythm.  Pulmonary/Chest: Breath  sounds normal. No respiratory distress. She has no wheezes. Large breasts affect postre  Abdominal: Soft.  Musculoskeletal:  Diffuse tenderness from neck, elbows, shoulders, and hands to her knees, low back and hips. Back pain with flexion and extension. Facet maneuvers positive, left greater than right.  She has tightness over the right lumbar paraspinal muscles. SLR and seated slump testing increased pain.  Cervical spine is less tender with palpation over the mid to lower segments, spurlings test equivocal on the right and negative on the leg.--she is having pain with palpation over the upper trap, splenius capitus muscles, worse with head flexion. She still has a head  forward position. She has tenderness thoughout all ext's today. Right shoulder painful with passive abd, ER, IR, ext, flexion---surrounding musculature tight.  Neurological: She is alert and oriented to person, place, and time. See above Psychiatric: Her behavior is normal. Judgment and thought content normal. Her mood appears anxious/ depressed. She tends to quickly move from complaint to complaint, and doesn't stay on topic very long  Assessment & Plan:   1. Persistent right cervical and shoulder girdle pain. 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  2. Depression with anxiety  3. Right Biceps tendonitis (both heads). Rotator cuff component as well. Chronic right shoulder pain  4.Fibromyalgia syndrome- I certainly feel that her central pain factors as well as her mood are becoming the biggest factors here, as evidenced by the fact that our interventions are only providing temporary benefit. Recent lab work up was unrevealing.  5. Celiac Disease  6. Bilateral Knee pain/OA   PLAN:  1. Effexor IR 150mg  bid for mood and energy  2. Continue klonopin  0.5mg  bid  3. Refilled tramadol #120 50mg  -- .  4. Increase trileptal to 300mg  bid 5. Continue voltaren gel for knees, neck, hands, feet--encouraged scheduled usage 6. Will order bilateral RF's at L4-5 and L5-S1 per Dr. Wynn Banker  7.Continue supplements as recommended. Would increase melatonin to two tabs. Consider TCA at night also again 8. 30 minutes of face to face patient care time were spent during this visit. All questions were encouraged and answered. i will see her back in 2 months.

## 2012-11-30 NOTE — Patient Instructions (Signed)
INCREASE YOUR MELATONIN TO 2 PILLS AT BEDTIME. TAKE WITH YOUR MUSCLE RELAXANT AS WELL

## 2012-12-16 ENCOUNTER — Ambulatory Visit (INDEPENDENT_AMBULATORY_CARE_PROVIDER_SITE_OTHER): Payer: 59 | Admitting: Podiatry

## 2012-12-16 ENCOUNTER — Encounter: Payer: Self-pay | Admitting: Podiatry

## 2012-12-16 VITALS — BP 137/84 | HR 67

## 2012-12-16 DIAGNOSIS — M21969 Unspecified acquired deformity of unspecified lower leg: Secondary | ICD-10-CM | POA: Insufficient documentation

## 2012-12-16 DIAGNOSIS — M722 Plantar fascial fibromatosis: Secondary | ICD-10-CM

## 2012-12-16 MED ORDER — DEXAMETHASONE SODIUM PHOSPHATE 10 MG/ML IJ SOLN: 4.0000 mg | Freq: Once | INTRAMUSCULAR | Status: DC

## 2012-12-16 MED ORDER — TRIAMCINOLONE ACETONIDE 40 MG/ML IJ SUSP: 8.0000 mg | Freq: Once | INTRAMUSCULAR | Status: DC

## 2012-12-16 NOTE — Progress Notes (Signed)
52 year old female patient presents complaining of Heel pain for about 2 months.  On feet not much.  Objective: Positive of pain at the right heel more than the left. Neurovascular status are within normal. Positive of elevated first ray bilateral. Radiographic examination show abnormal elevated first ray.  Assessment: Plantar fasciitis right. Deformed Metatarsal bilateral.  Treatment: Right heel injection given.  Injection consisted of Injected with mixture of 4 mg Dexamethasone, 4 mg Triamcinolone, and 1 cc of 0.5% Marcaine plain. Patient tolerated well without difficulty.  Night Splint dispensed for the right foot and leg. Reviewed benefit of Orthotic shoe inserts.

## 2012-12-16 NOTE — Patient Instructions (Addendum)
Seen for right heel pain. Injection given. Also dispensed Night Splint. Use night splint each night. Will check on Orthotic coverage. Return in 2 weeks.

## 2012-12-26 ENCOUNTER — Other Ambulatory Visit: Payer: Self-pay | Admitting: Physical Medicine & Rehabilitation

## 2012-12-26 ENCOUNTER — Other Ambulatory Visit: Payer: Self-pay | Admitting: Family Medicine

## 2012-12-28 ENCOUNTER — Ambulatory Visit (INDEPENDENT_AMBULATORY_CARE_PROVIDER_SITE_OTHER): Payer: 59 | Admitting: Podiatry

## 2012-12-28 VITALS — BP 143/86 | HR 69

## 2012-12-28 DIAGNOSIS — M21969 Unspecified acquired deformity of unspecified lower leg: Secondary | ICD-10-CM

## 2012-12-28 DIAGNOSIS — M722 Plantar fascial fibromatosis: Secondary | ICD-10-CM

## 2012-12-28 MED ORDER — TRIAMCINOLONE ACETONIDE 40 MG/ML IJ SUSP: 10.0000 mg | Freq: Once | INTRAMUSCULAR | Status: AC

## 2012-12-28 MED ORDER — DEXAMETHASONE SODIUM PHOSPHATE 10 MG/ML IJ SOLN: 5.0000 mg | Freq: Once | INTRAMUSCULAR | Status: AC

## 2012-12-28 NOTE — Progress Notes (Signed)
Subjective: 52 year old female presents stating that last Injection did not help her with right heel pain. Night Splint is helping some. Still have much pain on right heel.  Objective: No changes in condition. Positive of hypermobile first ray bilateral. Pain and tenderness with pressure to right heel plantar.  Assessment:  Plantar fasciitis right. Hypermobile first ray bilateral.  Plan: Reviewed clinical findings and available treatment options. Patient agreed to receive Injection and Orthotic treatment. Tx. #1. Injection given to right heel; Injection consisted of mixture of 4 mg Dexamethasone, 4 mg Triamcinolone, and 1 cc of 0.5% Marcaine plain. Patient tolerated well without difficulty.  Tx. # 2. Casted for Orthotics for both feet.Marland Kitchen

## 2012-12-29 ENCOUNTER — Ambulatory Visit: Payer: 59 | Admitting: Physical Medicine & Rehabilitation

## 2013-01-12 ENCOUNTER — Encounter: Payer: Self-pay | Admitting: Physical Medicine & Rehabilitation

## 2013-01-12 ENCOUNTER — Ambulatory Visit (HOSPITAL_BASED_OUTPATIENT_CLINIC_OR_DEPARTMENT_OTHER): Payer: 59 | Admitting: Physical Medicine & Rehabilitation

## 2013-01-12 ENCOUNTER — Encounter: Payer: 59 | Attending: Physical Medicine & Rehabilitation

## 2013-01-12 VITALS — BP 124/82 | HR 59 | Resp 14 | Ht 63.0 in | Wt 179.6 lb

## 2013-01-12 DIAGNOSIS — M47817 Spondylosis without myelopathy or radiculopathy, lumbosacral region: Secondary | ICD-10-CM

## 2013-01-12 DIAGNOSIS — M545 Low back pain, unspecified: Secondary | ICD-10-CM | POA: Insufficient documentation

## 2013-01-12 DIAGNOSIS — M47816 Spondylosis without myelopathy or radiculopathy, lumbar region: Secondary | ICD-10-CM

## 2013-01-12 MED ORDER — DIAZEPAM 10 MG PO TABS: 10.0000 mg | ORAL_TABLET | Freq: Four times a day (QID) | ORAL | Status: DC | PRN

## 2013-01-12 NOTE — Progress Notes (Signed)
  PROCEDURE RECORD [facility]   Name: Pamela Hubbard DOB:[birth year] MRN: [id number]  Date:01/12/2013  Physician: Claudette Laws, MD    Nurse/CMA: Alece Koppel RN  Allergies:  Allergies  Allergen Reactions  . Hydrocodone     Nausea, GI upset  . Methocarbamol Nausea Only  . Pregabalin Nausea Only    Consent Signed: yes  Is patient diabetic? no  CBG today?   Pregnant: no LMP: No LMP recorded. Patient is postmenopausal. (age 50-55)  Anticoagulants: no Anti-inflammatory: no Antibiotics: no  Procedure: Bilateral L4-5 L5-S1 Medial Branch Blocks Position: Prone Start Time:9:58  End Time: 10:09  Fluoro Time: 41 seconds  RN/CMA [employer]    Time 9:12 10:13    BP 124/82 129/70    Pulse 59 57    Respirations 14 14    O2 Sat 98 99    S/S 6 6    Pain Level 6/10 0/10     D/C home with husband, [name], D/C instructions reviewed, and sits independently.

## 2013-01-12 NOTE — Progress Notes (Signed)
Bilateral Lumbar L3, L4  medial branch blocks and L 5 dorsal ramus injection under fluoroscopic guidance  MRI 02/16/12 L4-5: Mild hypertrophic facet disease. No focal disc protrusion,  spinal or foraminal stenosis. There is a small synovial cyst  projecting posteriorly on the left.  L5-S1: Moderate hypertrophic facet changes. No focal disc  protrusion, spinal or foraminal stenosis.  50-60% relief with MBB  Indication: Lumbar pain which is not relieved by medication management or other conservative care and interfering with self-care and mobility.  Informed consent was obtained after describing risks and benefits of the procedure with the patient, this includes bleeding, infection, paralysis and medication side effects.  The patient wishes to proceed and has given written consent.  The patient was placed in prone position.  The lumbar area was marked and prepped with Betadine.  One mL of 1% lidocaine was injected into each of 6 areas into the skin and subcutaneous tissue.  Then a 22-gauge 3.5 in spinal needle was inserted targeting the junction of the left S1 superior articular process and sacral ala junction. Needle was advanced under fluoroscopic guidance.  Bone contact was made.  Omnipaque 180 was injected x 0.5 mL demonstrating no intravascular uptake.  Then a solution containing one mL of 4 mg per mL dexamethasone and 3 mL of 2% MPF lidocaine was injected x 0.5 mL.  Then the left L5 superior articular process in transverse process junction was targeted.  Bone contact was made.  Omnipaque 180 was injected x 0.5 mL demonstrating no intravascular uptake. Then a solution containing one mL of 4 mg per mL dexamethasone and 3 mL of 2% MPF lidocaine was injected x 0.5 mL.  Then the left L4 superior articular process in transverse process junction was targeted.  Bone contact was made.  Omnipaque 180 was injected x 0.5 mL demonstrating no intravascular uptake.  Then a solution containing one mL of 4 mg per mL  dexamethasone and 3 mL if 2% MPF lidocaine was injected x 0.5 mL.  This same procedure was performed on the right side using the same needle, technique and injectate.  Patient tolerated procedure well.  Post procedure instructions were given.

## 2013-01-12 NOTE — Patient Instructions (Addendum)
Lumbar medial branch blocks were performed. This is to help diagnose the cause of the low back pain. It is important that you keep track of your pain for the first day or 2 after injection. This injection can give you temporary relief that lasts for hours or up to several months. There is no way to predict duration of pain relief.  Please try to compare your pain after injection to for the injection.  If this injection gives you  temporary relief there may be another longer-lasting procedure that may be beneficial call radiofrequency ablation, we can do this next visit.  Take Valium prior to the next injection

## 2013-02-14 ENCOUNTER — Ambulatory Visit: Payer: 59 | Admitting: Physical Medicine & Rehabilitation

## 2013-02-20 ENCOUNTER — Ambulatory Visit (INDEPENDENT_AMBULATORY_CARE_PROVIDER_SITE_OTHER)
Admission: RE | Admit: 2013-02-20 | Discharge: 2013-02-20 | Disposition: A | Discharge status: Home / Self Care | Payer: 59 | Source: Ambulatory Visit | Attending: Family Medicine | Admitting: Family Medicine

## 2013-02-20 ENCOUNTER — Encounter: Payer: Self-pay | Admitting: Family Medicine

## 2013-02-20 ENCOUNTER — Ambulatory Visit (INDEPENDENT_AMBULATORY_CARE_PROVIDER_SITE_OTHER): Payer: 59 | Admitting: Family Medicine

## 2013-02-20 VITALS — BP 124/70 | HR 77 | Temp 98.0°F | Wt 185.0 lb

## 2013-02-20 DIAGNOSIS — M25569 Pain in unspecified knee: Secondary | ICD-10-CM

## 2013-02-20 DIAGNOSIS — M25562 Pain in left knee: Secondary | ICD-10-CM

## 2013-02-20 DIAGNOSIS — I10 Essential (primary) hypertension: Secondary | ICD-10-CM

## 2013-02-20 DIAGNOSIS — E669 Obesity, unspecified: Secondary | ICD-10-CM | POA: Insufficient documentation

## 2013-02-20 MED ORDER — TRAMADOL HCL 50 MG PO TABS: 50.0000 mg | ORAL_TABLET | Freq: Four times a day (QID) | ORAL | Status: DC | PRN

## 2013-02-20 MED ORDER — LOSARTAN POTASSIUM 100 MG PO TABS: 100.0000 mg | ORAL_TABLET | Freq: Every day | ORAL | Status: DC

## 2013-02-20 NOTE — Progress Notes (Signed)
  Subjective:    Patient ID: Pamela Hubbard, female    DOB: 1961/05/11, 52 y.o.   MRN: [id number]  HPI Acute visit. Left knee pain. Fell 1 month ago. She was in her kitchen and slipped. Not sure how she landed. She thinks this was a twisting type motion. Pain is somewhat poorly localized. Somewhat anterior and posterior as well as lateral and medial. Denies any swelling following her fall and no ecchymosis. No instability. Sharp pain. Worse with walking. Tramadol with some relief. Using topical creams which sounds like diclofenac.  Hypertension treated with losartan. Requesting refills. Compliant with therapy. No headaches. No dizziness. No recent chest pains.  Past Medical History  Diagnosis Date  . Depression   . Ulcer   . Confusion   . Vomiting   . Weight gain   . Weakness   . Neuromuscular disorder   . Gastritis   . Hypertension    Past Surgical History  Procedure Laterality Date  . Ovarian cyst surgery  2008  . Shoulder surgery  2010    injured at work    reports that she has never smoked. She has never used smokeless tobacco. She reports that she does not drink alcohol or use illicit drugs. family history is not on file. Allergies  Allergen Reactions  . Hydrocodone     Nausea, GI upset  . Methocarbamol Nausea Only  . Pregabalin Nausea Only      Review of Systems  Constitutional: Negative for fatigue.  Eyes: Negative for visual disturbance.  Respiratory: Negative for cough, chest tightness, shortness of breath and wheezing.   Cardiovascular: Negative for chest pain, palpitations and leg swelling.  Endocrine: Negative for polydipsia and polyuria.  Musculoskeletal: Negative for back pain.  Neurological: Negative for dizziness, seizures, syncope, weakness, light-headedness, numbness and headaches.       Objective:   Physical Exam  Constitutional: She appears well-developed and well-nourished.  Cardiovascular: Normal rate and regular rhythm.   Pulmonary/Chest:  Effort normal and breath sounds normal. No respiratory distress. She has no wheezes. She has no rales.  Musculoskeletal:  Left knee reveals full range of motion. No effusion. No ecchymosis. No warmth. She has mild medial and lateral jointline tenderness. Posterior and anterior crucial ligament testing is normal. Collateral ligament testing normal          Assessment & Plan:  Left knee pain following fall. Relatively nonfocal exam. Plain x-rays. Refill tramadol for as needed use  Hypertension. Stable. Refill losartan for one year

## 2013-02-21 ENCOUNTER — Telehealth: Payer: Self-pay

## 2013-02-21 NOTE — Telephone Encounter (Signed)
Patient request tramadol refill.  Looks like she had this filled by a Dentist.

## 2013-03-14 ENCOUNTER — Encounter: Payer: Self-pay | Admitting: Physical Medicine & Rehabilitation

## 2013-03-14 ENCOUNTER — Encounter: Payer: 59 | Attending: Physical Medicine & Rehabilitation

## 2013-03-14 ENCOUNTER — Ambulatory Visit (HOSPITAL_BASED_OUTPATIENT_CLINIC_OR_DEPARTMENT_OTHER): Payer: 59 | Admitting: Physical Medicine & Rehabilitation

## 2013-03-14 VITALS — BP 133/75 | HR 73 | Resp 14 | Ht 63.0 in | Wt 183.4 lb

## 2013-03-14 DIAGNOSIS — M47817 Spondylosis without myelopathy or radiculopathy, lumbosacral region: Secondary | ICD-10-CM

## 2013-03-14 DIAGNOSIS — M545 Low back pain, unspecified: Secondary | ICD-10-CM | POA: Insufficient documentation

## 2013-03-14 DIAGNOSIS — M47816 Spondylosis without myelopathy or radiculopathy, lumbar region: Secondary | ICD-10-CM

## 2013-03-14 MED ORDER — GABAPENTIN 600 MG PO TABS: 600.0000 mg | ORAL_TABLET | Freq: Three times a day (TID) | ORAL | Status: AC

## 2013-03-14 NOTE — Progress Notes (Signed)
Right L3-L4 medial branch and right L5 dorsal ramus radio frequency ablation Indication lumbar spondylosis with greater than 50% pain relieved for several days following bilateral L3-L4 medial branch and L5 dorsal ramus injections in April 20 14th and June 2014. Pain is only partially response medication management and other conservative care and interferes with self-care and mobility  RightL5 dorsal ramus., Right L4 and Right L3 medial branch radio frequency neuropathy under fluoroscopic guidance   Indication: Low back pain due to lumbar spondylosis which has been relieved on 2 occasions by greater than 50% by lumbar medial branch blocks at corresponding levels.  Informed consent was obtained after describing risks and benefits of the procedure with the patient, this includes bleeding, bruising, infection, paralysis and medication side effects. The patient wishes to proceed and has given written consent. The patient was placed in a prone position. The lumbar and sacral area was marked and prepped with Betadine. A 25-gauge 1-1/2 inch needle was inserted into the skin and subcutaneous tissue at 3 sites in one ML of 1% lidocaine was injected into each site. Then a 20-gauge 10cm cm radio frequency needle with a 1 cm curved active tip was inserted targeting the Right S1 SAP/sacral ala junction. Bone contact was made and confirmed with lateral imaging. Sensory stimulation at 50 Hz followed by motor stimulation at 2 Hz confirm proper needle location followed by injection of one ML of the solution containing one ML of 4 mg per mL dexamethasone and 3 mL of 1% MPF lidocaine. Then the Right L5 SAP/transverse process junction was targeted. Bone contact was made and confirmed with lateral imaging. Sensory stimulation at 50 Hz followed by motor stimulation at 2 Hz confirm proper needle location followed by injection of one ML of the solution containing one ML of 4 mg per mL dexamethasone and 3 mL of 1% MPF lidocaine. Then  the Right L4 SAP/transverse process junction was targeted. Bone contact was made and confirmed with lateral imaging. Sensory stimulation at 50 Hz followed by motor stimulation at 2 Hz confirm proper needle location followed by injection of one ML of the solution containing one ML of 4 mg per mL dexamethasone and 3 mL of 1% MPF lidocaine. Radio frequency lesion being at [facility] for 90 seconds was performed. Needles were removed. Post procedure instructions and vital signs were performed. Patient tolerated procedure well. Followup appointment was given.

## 2013-03-14 NOTE — Patient Instructions (Addendum)
Take gabapentin 3 tablets today after you get home  Return in one month to get the procedure done on the left side  He may have soreness for 1 or 2 weeks on the right side after this procedure

## 2013-03-14 NOTE — Progress Notes (Signed)
  PROCEDURE RECORD [facility]   Name: Pamela Hubbard DOB:1961/03/04 MRN: [id number]  Date:03/14/2013  Physician: Claudette Laws, MD    Nurse/CMA: Kelli Churn RN/ Berle Mull CMA  Allergies:  Allergies  Allergen Reactions  . Hydrocodone     Nausea, GI upset  . Methocarbamol Nausea Only  . Pregabalin Nausea Only    Consent Signed: yes  Is patient diabetic? no  CBG today?  Pregnant: no LMP: No LMP recorded. Patient is postmenopausal. (age 75-55)  Anticoagulants: no Anti-inflammatory: no Antibiotics: no  Procedure: L3-4-5 Right Radiofrequency  Position: Prone Start Time: 2:27 End Time: 2:42 Fluoro Time: 27 seconds  RN/CMA Shumaker/[place] Shumaker/[place]    Time 2:04 2:47    BP 133/75 130/58    Pulse 73 66    Respirations 14 14    O2 Sat 98 99    S/S 6 6    Pain Level 6 0     D/C home with husband, [name], D/C instructions reviewed, and sits independently.

## 2013-03-15 ENCOUNTER — Telehealth: Payer: Self-pay | Admitting: Physical Medicine & Rehabilitation

## 2013-03-15 NOTE — Telephone Encounter (Signed)
I talked with Pamela Hubbard and exlained that it is our routine to do the other side in a month.  We do not do the RF procedure on both sides at one time typically.  She was thinking that she was told to come back today.  I told her that Dr Wynn Banker is not even in the office on Wednesdays so he would not have told her to come back today for the other side.  Her appt is 04/11/13 for the left side.  Her knee is hurting but her xrays did not reveal any abnormality so I told her that she could discusse with DR Wynn Banker but if it gets worse she can try and get an appt with Riley Kill sooner.

## 2013-03-15 NOTE — Telephone Encounter (Signed)
Pt was called to inform her appointment time had changed... Pt was not happy or did not understand why she did not have both her injections on the same day ( 08.26.2014) .. Pt was advise that a message would be sent to the clinical staff, but I want her to know as of now her time of her appointment has been changed...  ° °

## 2013-03-15 NOTE — Telephone Encounter (Signed)
Pt was called to inform her appointment time had changed... Pt was not happy or did not understand why she did not have both her injections on the same day ( 08.26.2014) .Marland Kitchen Pt was advise that a message would be sent to the clinical staff, but I want her to know as of now her time of her appointment has been changed.Marland KitchenMarland Kitchen

## 2013-04-06 ENCOUNTER — Other Ambulatory Visit: Payer: Self-pay

## 2013-04-06 MED ORDER — TRAMADOL HCL 50 MG PO TABS: 50.0000 mg | ORAL_TABLET | Freq: Four times a day (QID) | ORAL | Status: DC | PRN

## 2013-04-06 NOTE — Telephone Encounter (Signed)
Patient called to get tramadol refill sent to [facility].  Medication called in.  Patient aware.

## 2013-04-11 ENCOUNTER — Ambulatory Visit: Payer: 59 | Admitting: Physical Medicine & Rehabilitation

## 2013-04-17 ENCOUNTER — Telehealth: Payer: Self-pay | Admitting: Family Medicine

## 2013-04-17 NOTE — Telephone Encounter (Signed)
Patient Information:  Caller Name: Kathey  Phone: [phone]  Patient: Pamela Hubbard, Pamela Hubbard  Gender: Female  DOB: 1960-11-14  Age: 52 Years  PCP: Evelena Peat [facility])  Pregnant: No  Office Follow Up:  Does the office need to follow up with this patient?: No  Instructions For The Office: N/A  RN Note:  Instructed patient to hang up and call 911.  Symptoms  Reason For Call & Symptoms: Onset one week chest pain and now pressure (rates 5/10)  Reviewed Health History In EMR: Yes  Reviewed Medications In EMR: Yes  Reviewed Allergies In EMR: Yes  Reviewed Surgeries / Procedures: Yes  Date of Onset of Symptoms: 04/10/2013 OB / GYN:  LMP: Unknown  Guideline(s) Used:  Chest Pain  Disposition Per Guideline:   Call EMS 911 Now  Reason For Disposition Reached:   Chest pain lasting longer than 5 minutes and ANY of the following:  Over 41 years old Over 103 years old and at least one cardiac risk factor (i.e., high blood pressure, diabetes, high cholesterol, obesity, smoker or strong family history of heart disease) Pain is crushing, pressure-like, or heavy  Took nitroglycerin and chest pain was not relieved History of heart disease (i.e., angina, heart attack, bypass surgery, angioplasty, CHF)  Advice Given:  N/A  Patient Will Follow Care Advice:  YES

## 2013-04-17 NOTE — Telephone Encounter (Signed)
ED Notification 

## 2013-04-18 ENCOUNTER — Encounter: Payer: 59 | Admitting: Physical Medicine & Rehabilitation

## 2013-04-18 ENCOUNTER — Ambulatory Visit (INDEPENDENT_AMBULATORY_CARE_PROVIDER_SITE_OTHER): Payer: 59 | Admitting: Family Medicine

## 2013-04-18 ENCOUNTER — Encounter: Payer: Self-pay | Admitting: Family Medicine

## 2013-04-18 VITALS — BP 132/82 | HR 61 | Temp 97.8°F | Wt 180.0 lb

## 2013-04-18 DIAGNOSIS — I10 Essential (primary) hypertension: Secondary | ICD-10-CM

## 2013-04-18 DIAGNOSIS — R0609 Other forms of dyspnea: Secondary | ICD-10-CM

## 2013-04-18 DIAGNOSIS — R079 Chest pain, unspecified: Secondary | ICD-10-CM

## 2013-04-18 DIAGNOSIS — R0600 Dyspnea, unspecified: Secondary | ICD-10-CM

## 2013-04-18 DIAGNOSIS — L639 Alopecia areata, unspecified: Secondary | ICD-10-CM | POA: Insufficient documentation

## 2013-04-18 MED ORDER — TRIAMCINOLONE ACETONIDE 0.1 % EX CREA: TOPICAL_CREAM | Freq: Two times a day (BID) | CUTANEOUS | Status: AC

## 2013-04-18 NOTE — Progress Notes (Signed)
  Subjective:    Patient ID: Pamela Hubbard, female    DOB: [birth year], 52 y.o.   MRN: [id number]  HPI Patient has history of multiple medical problems including hypertension, obesity, osteoarthritis, chronic myalgias and chronic back pain. She is followed by pain management. She has hypertension treated with losartan. Blood pressures been generally well-controlled.  She is seen with complaints of intermittent dyspnea over the past week. She's had some substernal chest pressure without radiation. Her symptoms generally last much of the day. She's not any correlation with exertion. Nonsmoker. No pleuritic pain. No cough. No fevers or chills. No leg edema and no leg pain.  No family history of premature CAD. Hypertension has been well controlled. No history of diabetes.  History of focal hair loss.   Treated previously by derm with topical steroid and injection of steroid.  Past Medical History  Diagnosis Date  . Depression   . Ulcer   . Confusion   . Vomiting   . Weight gain   . Weakness   . Neuromuscular disorder   . Gastritis   . Hypertension    Past Surgical History  Procedure Laterality Date  . Ovarian cyst surgery  2008  . Shoulder surgery  2010    injured at work    reports that she has never smoked. She has never used smokeless tobacco. She reports that she does not drink alcohol or use illicit drugs. family history is not on file. Allergies  Allergen Reactions  . Hydrocodone     Nausea, GI upset  . Methocarbamol Nausea Only  . Pregabalin Nausea Only      Review of Systems  Constitutional: Positive for fatigue. Negative for unexpected weight change.  Eyes: Negative for visual disturbance.  Respiratory: Positive for shortness of breath. Negative for cough, chest tightness and wheezing.   Cardiovascular: Positive for chest pain. Negative for palpitations and leg swelling.  Gastrointestinal: Negative for abdominal pain.  Neurological: Negative for dizziness, seizures,  syncope, weakness, light-headedness and headaches.       Objective:   Physical Exam  Constitutional: She appears well-developed and well-nourished. No distress.  Neck: Neck supple. No thyromegaly present.  Cardiovascular: Normal rate and regular rhythm.   Pulmonary/Chest: Effort normal and breath sounds normal. No respiratory distress. She has no wheezes. She has no rales.  Musculoskeletal: She exhibits no edema.  Skin:  1cm circumferential area occipital scalp with hair loss and nonscaly.          Assessment & Plan:  Atypical chest pain. Check EKG. Consider echocardiogram. EKG NSR with no acute changes.  Hypertension. Well controlled. Continue losartan.  Alopecia areata.  Refilled topical steroid.

## 2013-04-18 NOTE — Patient Instructions (Addendum)
Alopecia Areata  Alopecia areata is a self-destructing (autoimmune) disease that results in the loss of hair. In this condition your body's immune system attacks the hair follicle. The hair follicle is responsible for growing hair. Hair loss can occur on the scalp and other parts of the body. It usually starts as one or more small, round, smooth patches of hair loss. It occurs in males and females of all ages and races, but usually starts before age 50. The scalp is the most commonly affected area, but the beard or any hair-bearing site can be affected. This type of hair loss does not leave scars where the hair was lost.   Many people with alopecia areata only have a few patches of hair loss. In others, extensive patchy hair loss occurs. In a few people, all scalp hair is lost (alopecia totalis), or hair is lost from the entire scalp and body (alopecia universalis). No matter how widespread the hair loss, the hair follicles remain alive and are ready to resume normal hair production whenever they receive the correct signal. Hair re-growth may occur without treatment and can even restart after years of hair loss.   CAUSES   It is thought that something triggers the immune system to stop hair growth. It is not always known what the cause is. Some people have genetic markers that can increase the chance of developing alopecia areata. Alopecia areata often occurs in families whose members have had:  · Asthma.  · Hay fever.  · Atopic eczema.  · Some autoimmune diseases may also be a trigger, such as:  · Thyroid disease.  · Diabetes.  · Rheumatoid arthritis.  · Lupus erythematosus.  · Vitiligo.  · Pernicious anemia.  · Addison's disease.  OTHER SYMPTOMS  In some people, the nail beds may develop rows of tiny dents (stippling) or the nail beds can become distorted. Other than the hair and nail beds, no other body part is affected.   PROGNOSIS   Alopecia areata is not medically disabling. People with alopecia areata are  usually in excellent health. Hair loss can be emotionally difficult. The National Alopecia Areata Foundation has resources available to help individuals and families with alopecia areata. Their goal is to help people with the condition live full, productive lives. There are many successful, well-adjusted, contented people living with Alopecia areata. Alopecia areata can be overcome with:  · A positive self image.  · Sound medical facts.  · The support of others, such as:  · Sometimes professional counseling is helpful to develop one's self-confidence and positive self-image.  TREATMENT   There is no cure for alopecia areata. There are several available treatments. Treatments are most effective in milder cases. No treatment is effective for everyone. Choice of treatment depends mainly on a person's age and the extent of their hair loss.  Alopecia areata occurs in two forms:   · A mild patchy form where less than 50 percent of scalp hair is lost.  · An extensive form where greater than 50 percent of scalp hair is lost.  These two forms of alopecia areata behave quite differently, and the choice of treatment depends on which form is present. Current treatments do not turn alopecia areata off. They can stimulate the hair follicle to produce hair.   Some medications used to treat mild cases include:  · Cortisone injections. The most common treatment is the injection of cortisone into the bare skin patches. The injections are usually given by a caregiver specializing   in skin issues (dermatologist). This caregiver will use a tiny needle to give multiple injections into the skin in and around the bare patches. The injections are repeated once a month. If new hair growth occurs, it is usually visible within 4 weeks. Treatment does not prevent new patches of hair loss from developing. There are few side effects from local cortisone injections. Occasionally, temporary dents (depressions) in the skin result from the local  injections, but these dents can fill in by themselves.  · Topical minoxidil. Five percent topical minoxidil solution applied twice daily may grow hair in alopecia areata. Scalp, eyebrows, and beard hair may respond. If scalp hair re-grows completely, treatment can be stopped. Response may improve if topical cortisone cream is applied 30 minutes after the minoxidil. Topical minoxidil is safe, easy to use, and does not lower blood pressure in persons with normal blood pressure. Minoxidil can lead to unwanted facial hair growth in some people.  · Anthralin cream or ointment. Another treatment is the application of anthralin cream or ointment. Anthralin is a tar-like substance that has been used widely for psoriasis. Anthralin is applied to the bare patches once daily. It is washed off after a short time, usually 30 to 60 minutes later. If new hair growth occurs, it is seen in 8 to 12 weeks. Anthralin can be irritating to the skin. It can cause temporary, brownish discoloration of the treated skin. By using short treatment times, skin irritation and skin staining are reduced without decreasing effectiveness. Care must be taken not to get anthralin in the eyes.  Some of the medications used for more extensive cases where there is greater than 50% hair loss include:  · Cortisone pills. Cortisone pills are sometimes given for extensive scalp hair loss. Cortisone taken internally is much stronger than local injections of cortisone into the skin. It is necessary to discuss possible side effects of cortisone pills with your caregiver. In general, however, cortisone pills are used in relatively few patients with alopecia areata due to health risks from prolonged use. Also, hair that has grown is likely to fall out when the cortisone pills are stopped.  · Topical minoxidil. See previous explanation under mild, patchy alopecia areata. However, minoxidil is not effective in total loss of scalp hair (alopecia totalis).  · Topical  immunotherapy. Another method of treating alopecia areata or alopecia totalis/universalis involves producing an allergic rash or allergic contact dermatitis. Chemicals such as diphencyprone (DPCP) or squaric acid dibutyl ester (SADBE) are applied to the scalp to produce an allergic rash which resembles poison oak or ivy. Approximately 40% of patients treated with topical immunotherapy will re-grow scalp hair after about 6 months of treatment. Those who do successfully re-grow scalp hair will need to continue treatment to maintain hair re-growth.  · Wigs. For extensive hair loss, a wig can be an important option for some people. Proper attention will make a quality wig look completely natural. A wig will need to be cut, thinned, and styled. To keep a net base wig from falling off, special double-sided tape can be purchased in beauty supply outlets and fastened to the inside of the wig.  · For those with completely bare heads, there are suction caps to which any wig can be attached. There are also entire suction cap wig units.  FOR MORE INFORMATION  National Alopecia Areata Foundation: www.naaf.org  Document Released: 02/08/2004 Document Revised: 09/28/2011 Document Reviewed: 09/11/2008  [employer]® Patient Information ©2014 [employer].

## 2013-04-25 ENCOUNTER — Telehealth: Payer: Self-pay | Admitting: Family Medicine

## 2013-04-25 NOTE — Telephone Encounter (Signed)
Spoke with pt in regard to her Echo, her husband recently lost his job and they no longer have insurance or the extra money to pay for the Echo out of pocket. Pt was hoping to hold off until her husband finds a new job and they have insurance. Please advise.

## 2013-04-25 NOTE — Telephone Encounter (Signed)
OK to wait unless her dyspnea worsens.

## 2013-04-25 NOTE — Telephone Encounter (Signed)
Left message for patient to return call.

## 2013-04-26 NOTE — Telephone Encounter (Signed)
Pt informed

## 2013-04-26 NOTE — Telephone Encounter (Signed)
Left message for patient to return call.

## 2013-05-02 ENCOUNTER — Ambulatory Visit: Payer: 59 | Admitting: Physical Medicine & Rehabilitation

## 2013-05-05 ENCOUNTER — Telehealth: Payer: Self-pay | Admitting: Physical Medicine & Rehabilitation

## 2013-05-05 MED ORDER — TRAMADOL HCL 50 MG PO TABS: 50.0000 mg | ORAL_TABLET | Freq: Four times a day (QID) | ORAL | Status: DC | PRN

## 2013-05-05 NOTE — Telephone Encounter (Signed)
done

## 2013-05-05 NOTE — Telephone Encounter (Signed)
Patient needing a refill on Tramadol °

## 2013-05-10 ENCOUNTER — Ambulatory Visit: Payer: 59 | Admitting: Physical Medicine & Rehabilitation

## 2013-06-19 ENCOUNTER — Telehealth: Payer: Self-pay

## 2013-06-19 MED ORDER — TRAMADOL HCL 50 MG PO TABS: 50.0000 mg | ORAL_TABLET | Freq: Four times a day (QID) | ORAL | Status: DC | PRN

## 2013-06-19 NOTE — Telephone Encounter (Signed)
Patient called requesting tramadol refill.  Patient says she takes this medication daily.  She has not been seen since august.  She has cancelled numerous times.  Offered her an appointment for 06/20/2013 but she says now she does not have insurance or money.  Please advise.

## 2013-06-19 NOTE — Telephone Encounter (Signed)
Will refill this last time without a visit. i am sorry

## 2013-06-19 NOTE — Telephone Encounter (Signed)
Patient informed tramadol called into [facility].  Advised she would need to be seen in order to get further refills.

## 2013-06-20 ENCOUNTER — Encounter: Payer: Self-pay | Attending: Physical Medicine & Rehabilitation | Admitting: Physical Medicine & Rehabilitation

## 2013-06-20 ENCOUNTER — Encounter: Payer: Self-pay | Admitting: Physical Medicine & Rehabilitation

## 2013-06-20 VITALS — BP 128/80 | HR 86 | Resp 14 | Ht 63.0 in | Wt 180.6 lb

## 2013-06-20 DIAGNOSIS — I10 Essential (primary) hypertension: Secondary | ICD-10-CM | POA: Insufficient documentation

## 2013-06-20 DIAGNOSIS — M25519 Pain in unspecified shoulder: Secondary | ICD-10-CM | POA: Insufficient documentation

## 2013-06-20 DIAGNOSIS — M170 Bilateral primary osteoarthritis of knee: Secondary | ICD-10-CM

## 2013-06-20 DIAGNOSIS — R413 Other amnesia: Secondary | ICD-10-CM | POA: Insufficient documentation

## 2013-06-20 DIAGNOSIS — M1710 Unilateral primary osteoarthritis, unspecified knee: Secondary | ICD-10-CM | POA: Insufficient documentation

## 2013-06-20 DIAGNOSIS — M7520 Bicipital tendinitis, unspecified shoulder: Secondary | ICD-10-CM | POA: Insufficient documentation

## 2013-06-20 DIAGNOSIS — F341 Dysthymic disorder: Secondary | ICD-10-CM | POA: Insufficient documentation

## 2013-06-20 DIAGNOSIS — IMO0001 Reserved for inherently not codable concepts without codable children: Secondary | ICD-10-CM | POA: Insufficient documentation

## 2013-06-20 DIAGNOSIS — Z79899 Other long term (current) drug therapy: Secondary | ICD-10-CM | POA: Insufficient documentation

## 2013-06-20 DIAGNOSIS — M19019 Primary osteoarthritis, unspecified shoulder: Secondary | ICD-10-CM

## 2013-06-20 DIAGNOSIS — M25569 Pain in unspecified knee: Secondary | ICD-10-CM | POA: Insufficient documentation

## 2013-06-20 DIAGNOSIS — M7521 Bicipital tendinitis, right shoulder: Secondary | ICD-10-CM

## 2013-06-20 DIAGNOSIS — M542 Cervicalgia: Secondary | ICD-10-CM | POA: Insufficient documentation

## 2013-06-20 DIAGNOSIS — M47817 Spondylosis without myelopathy or radiculopathy, lumbosacral region: Secondary | ICD-10-CM

## 2013-06-20 DIAGNOSIS — G8929 Other chronic pain: Secondary | ICD-10-CM | POA: Insufficient documentation

## 2013-06-20 DIAGNOSIS — M47816 Spondylosis without myelopathy or radiculopathy, lumbar region: Secondary | ICD-10-CM

## 2013-06-20 DIAGNOSIS — K900 Celiac disease: Secondary | ICD-10-CM | POA: Insufficient documentation

## 2013-06-20 DIAGNOSIS — M19011 Primary osteoarthritis, right shoulder: Secondary | ICD-10-CM

## 2013-06-20 DIAGNOSIS — IMO0002 Reserved for concepts with insufficient information to code with codable children: Secondary | ICD-10-CM

## 2013-06-20 MED ORDER — AMITRIPTYLINE HCL 10 MG PO TABS: 10.0000 mg | ORAL_TABLET | Freq: Every day | ORAL | Status: AC

## 2013-06-20 MED ORDER — TRAMADOL HCL 50 MG PO TABS: 50.0000 mg | ORAL_TABLET | Freq: Four times a day (QID) | ORAL | Status: AC | PRN

## 2013-06-20 NOTE — Progress Notes (Signed)
Subjective:    Patient ID: Pamela Hubbard, female    DOB: Jan 26, 1961, 52 y.o.   MRN: [id number]  HPI  Pamela Hubbard is back regarding her chronic pain. She saw Dr. Teressa Senter who recommended conservative care. She saw Dr. Eulah Pont who injected her knees without any substantial benefits. I last saw her 7 months ago. She continues on tramadol 50mg  2-3 x daily. She is also on flexeril, trileptal, effexor, and mobic. She does do some stretching and basic exercise at home.   Her pain continues to be widespread involving her neck, shoulder, low back, knees, sometimes her feet hands, and multiple muscle areas.   Her mood has been fair, but she still complains of depression. She also notes ongoing memory issues, fatigue, etc.   The lumbar RF's from Dr. Wynn Banker proved unsuccessful.   Pain Inventory Average Pain 8 Pain Right Now 7 My pain is stabbing  In the last 24 hours, has pain interfered with the following? General activity 2 Relation with others 2 Enjoyment of life 2 What TIME of day is your pain at its worst? all day Sleep (in general) Fair  Pain is worse with: walking, bending and sitting Pain improves with: rest Relief from Meds: 2  Mobility walk without assistance walk with assistance use a cane ability to climb steps?  no transfers alone  Function not employed: date last employed na I need assistance with the following:  dressing and household duties  Neuro/Psych weakness numbness trouble walking spasms dizziness confusion depression  Prior Studies Any changes since last visit?  no  Physicians involved in your care Any changes since last visit?  no   History reviewed. No pertinent family history. History   Social History  . Marital Status: Married    Spouse Name: N/A    Number of Children: 3  . Years of Education: N/A   Occupational History  . Housewife    Social History Main Topics  . Smoking status: Never Smoker   . Smokeless tobacco: Never Used  . Alcohol  Use: No  . Drug Use: No  . Sexual Activity: None   Other Topics Concern  . None   Social History Narrative  . None   Past Surgical History  Procedure Laterality Date  . Ovarian cyst surgery  2008  . Shoulder surgery  2010    injured at work   Past Medical History  Diagnosis Date  . Depression   . Ulcer   . Confusion   . Vomiting   . Weight gain   . Weakness   . Neuromuscular disorder   . Gastritis   . Hypertension   . Leg pain    BP 128/80  Pulse 86  Resp 14  Ht 5\' 3"  (1.6 m)  Wt 180 lb 9.6 oz (81.92 kg)  BMI 32.00 kg/m2  SpO2 98%     Review of Systems  Respiratory:       Respiratory infections  Gastrointestinal: Positive for constipation.  Musculoskeletal: Positive for back pain and gait problem.  Neurological: Positive for dizziness, weakness and numbness.       Spasms  Psychiatric/Behavioral: Positive for confusion and dysphoric mood.  All other systems reviewed and are negative.       Objective:   Physical Exam  Constitutional: She is oriented to person, place, and time. She appears well-developed and well-nourished. She may have lost some weight.  HENT:  Head: Normocephalic and atraumatic.  Nose: Nose normal.  Mouth/Throat: Oropharynx is clear and moist.  Eyes: Conjunctivae and EOM are normal. Pupils are equal, round, and reactive to light.  Neck: Normal range of motion.  Cardiovascular: Normal rate and regular rhythm.  Pulmonary/Chest: Breath sounds normal. No respiratory distress. She has no wheezes. Large breasts affect postre  Abdominal: Soft.  Musculoskeletal:  Diffuse tenderness from neck, elbows, shoulders, and hands to her knees, low back and hips. Back pain with flexion and extension. Facet maneuvers remain positive, She has tightness over the right lumbar paraspinal muscles. SLR and seated slump testing increased pain.  Cervical spine is   tender with palpation over the mid to lower segments, spurlings test equivocal on the right and  negative on the leg.--she is having pain with palpation over the upper trap, splenius capitus muscles, worse with head flexion. She still has somewhat of a head forward position. She has tenderness thoughout all ext's, joints today. Right shoulder painful with passive abd, ER, IR, ext, flexion---surrounding musculature tight as well. Neurological: She is alert and oriented to person, place, and time. See above Psychiatric: Her behavior is normal. Judgment and thought content normal. Her mood appears   Depressed, less anxious.      Assessment & Plan:   1. Persistent right cervical and shoulder girdle pain. 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  2. Depression with anxiety  3. Right Biceps tendonitis (both heads). Rotator cuff component. Chronic right shoulder pain  4.Fibromyalgia syndrome- this appears to be the ongoing, biggest problem 5. Celiac Disease  6. Bilateral Knee pain/OA --I believe this is relatively mild and more a reflection of her fibromyalgia state  PLAN:  1. Effexor IR 150mg  bid for mood and energy---this should continue 2. Continue klonopin 0.5mg  bid  3. Refilled tramadol #90 50mg  -- .  4. Continue trileptal to 300mg  bid  5. Continue voltaren gel for knees, neck, hands, feet--she finds this helpful 6. Knee management per Dr. Eulah Pont although I think her FMS is the bigger problem 7. Will try LOW dose elavil, 10mg  qhs to help with sleep and fibro pain.  8. 30 minutes of face to face patient care time were spent during this visit. All questions were encouraged and answered. i will see her back in 3 months. Consider a stimulant to help with energy and concentration

## 2013-06-20 NOTE — Patient Instructions (Signed)
PLEASE INCREASE YOUR EXERCISE!!!!

## 2013-09-18 ENCOUNTER — Encounter: Payer: Self-pay | Admitting: Physical Medicine & Rehabilitation

## 2014-01-25 ENCOUNTER — Other Ambulatory Visit: Payer: Self-pay | Admitting: Physical Medicine & Rehabilitation

## 2014-02-02 ENCOUNTER — Other Ambulatory Visit: Payer: Self-pay | Admitting: Physical Medicine & Rehabilitation

## 2014-02-21 ENCOUNTER — Other Ambulatory Visit: Payer: Self-pay | Admitting: Family Medicine

## 2014-02-21 ENCOUNTER — Telehealth: Payer: Self-pay | Admitting: Family Medicine

## 2014-02-21 MED ORDER — LOSARTAN POTASSIUM 100 MG PO TABS: 100.0000 mg | ORAL_TABLET | Freq: Every day | ORAL | Status: AC

## 2014-02-21 NOTE — Telephone Encounter (Signed)
RX sent to pharmacy  

## 2014-02-21 NOTE — Telephone Encounter (Signed)
Pt states she out of town in [place] and is not sure when she will be returning. Pt rx losartan (COZAAR) 100 MG tablet has expired from the pharmacy. Pt requesting 1 more refill until she returns back to [place] to see the doctor. Send to [facility] at [phone].

## 2015-02-20 IMAGING — CR DG KNEE COMPLETE 4+V*L*
4 series · 4 of 4 positions shown · non-contrast
Comparison: 08/03/2012

CLINICAL DATA: Left knee pain for 1 month, fell 1 month ago

LEFT KNEE - COMPLETE 4+ VIEW

[view not recorded (1 of 4)]
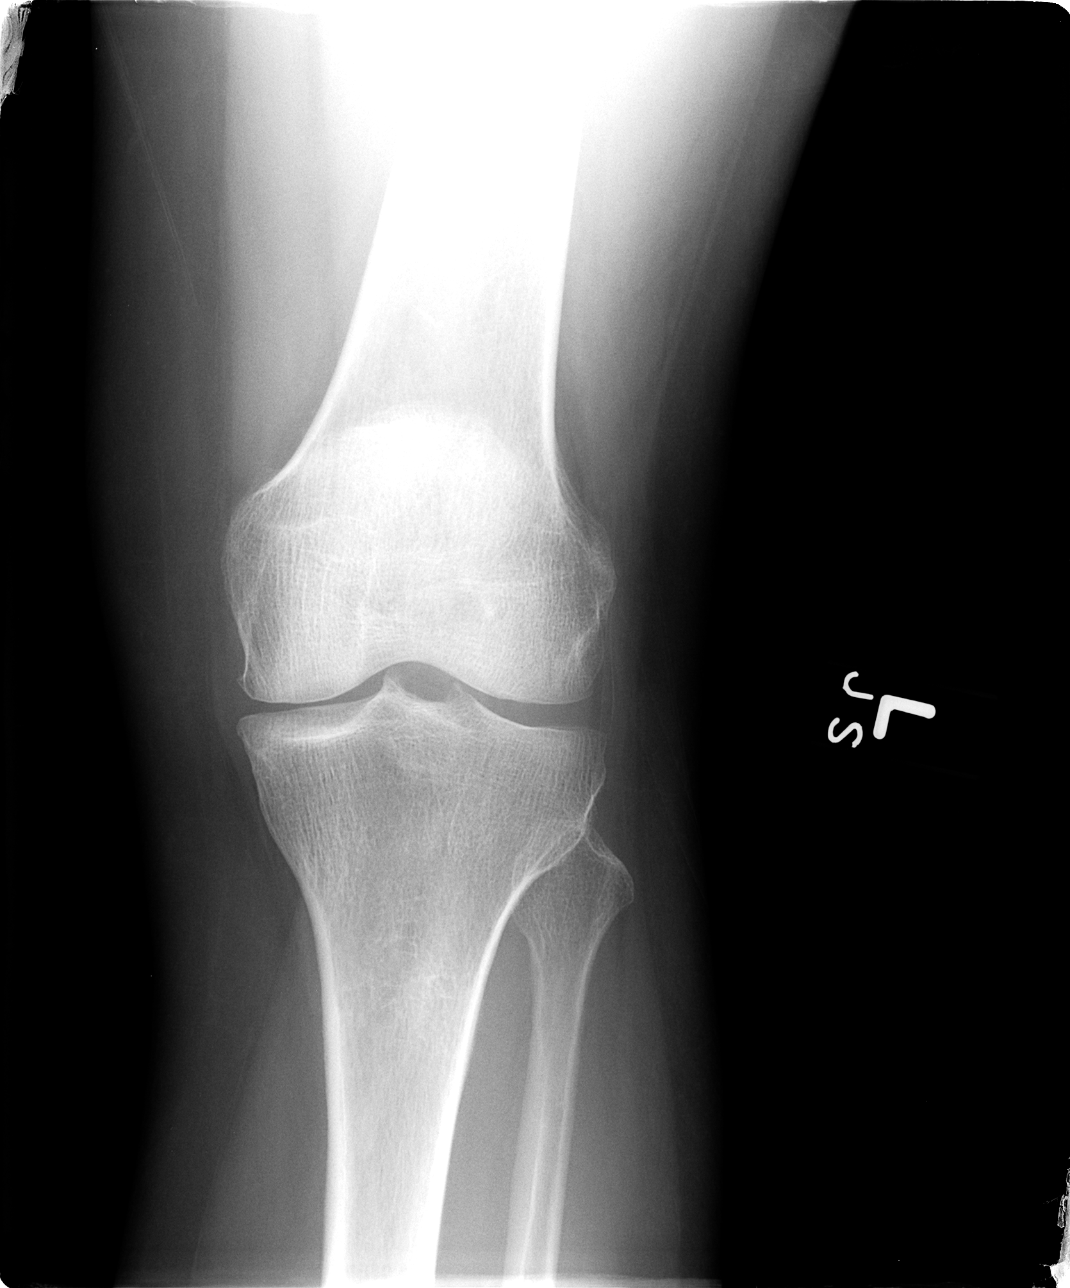

[view not recorded (2 of 4)]
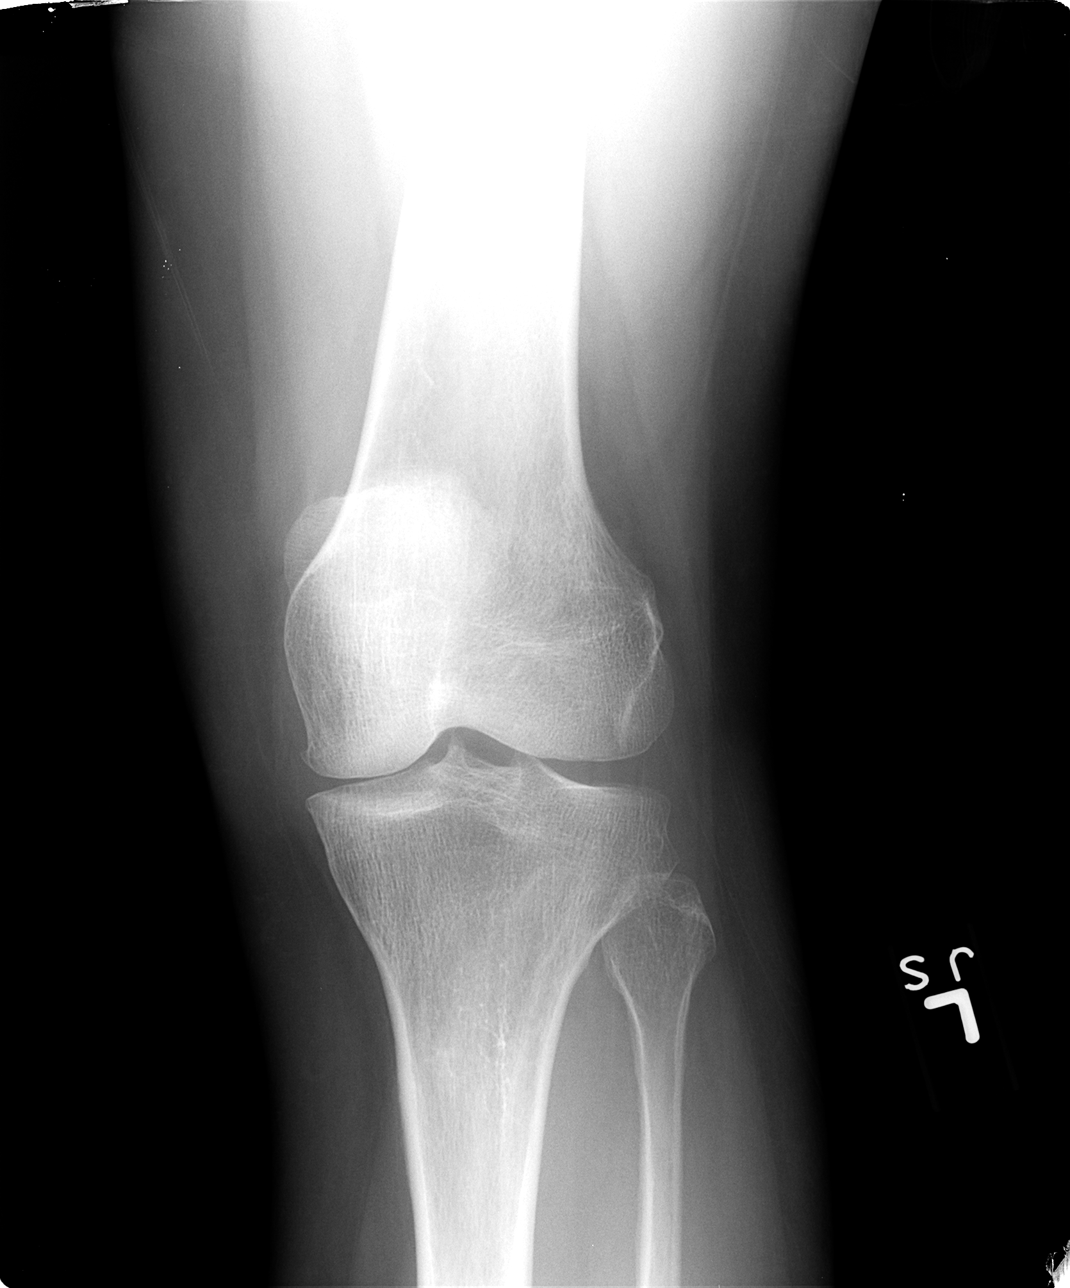

[view not recorded (3 of 4)]
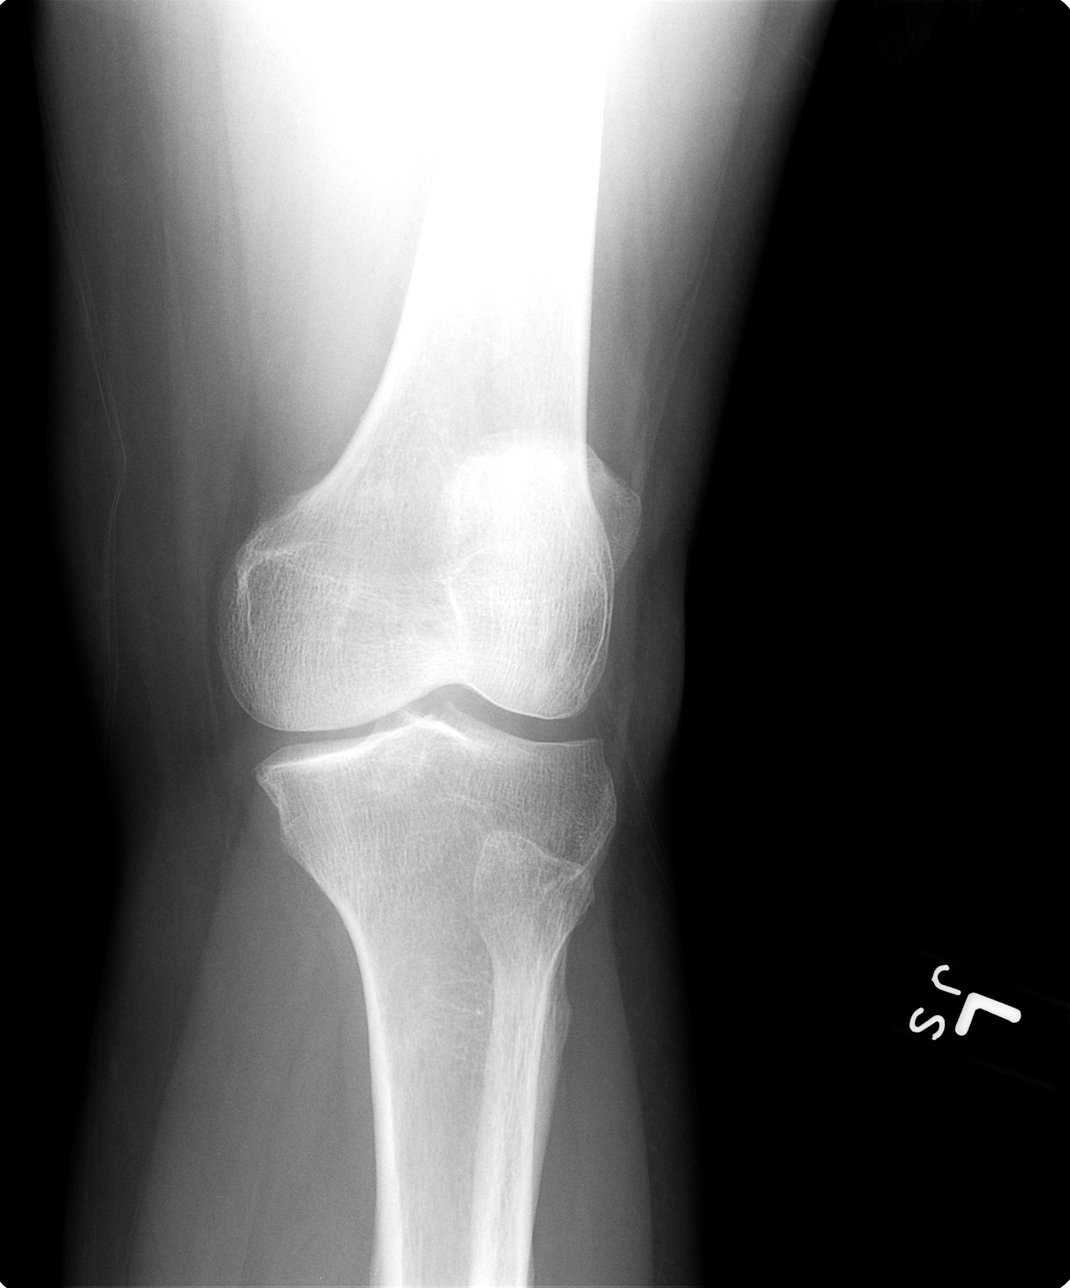

[view not recorded (4 of 4)]
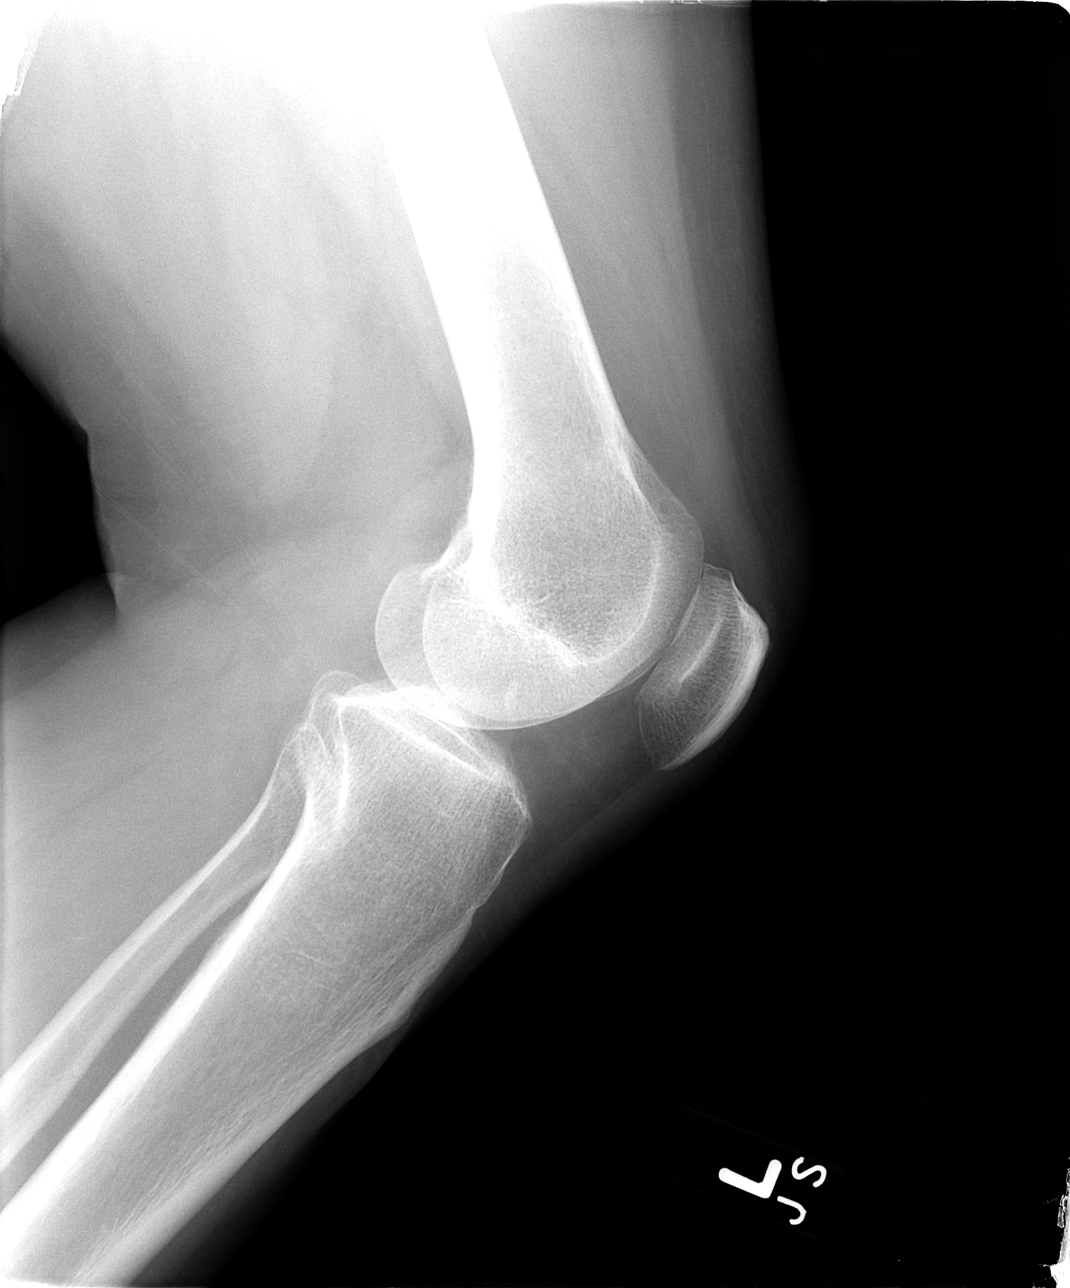

[4 of 4 positions shown; findings below may reference images not displayed]

FINDINGS: Question minimal medial compartment joint space narrowing.
Joint space otherwise preserved.
Osseous mineralization normal.
No acute fracture, dislocation or bone destruction.
No knee joint effusion or regional soft tissue abnormality.
IMPRESSION: No acute osseous abnormalities.
